# Patient Record
Sex: Female | Born: 1990
Health system: Southern US, Community
[De-identification: ages and names within clinical notes are randomized; demographics above are authoritative.]

## PROBLEM LIST (undated history)

## (undated) ENCOUNTER — Inpatient Hospital Stay (HOSPITAL_COMMUNITY): Payer: 59

## (undated) DIAGNOSIS — R000 Tachycardia, unspecified: Secondary | ICD-10-CM

## (undated) DIAGNOSIS — I2699 Other pulmonary embolism without acute cor pulmonale: Secondary | ICD-10-CM

## (undated) DIAGNOSIS — M419 Scoliosis, unspecified: Secondary | ICD-10-CM

## (undated) DIAGNOSIS — R002 Palpitations: Secondary | ICD-10-CM

## (undated) DIAGNOSIS — O139 Gestational [pregnancy-induced] hypertension without significant proteinuria, unspecified trimester: Secondary | ICD-10-CM

## (undated) HISTORY — DX: Other pulmonary embolism without acute cor pulmonale: I26.99

## (undated) HISTORY — PX: BACK SURGERY: SHX140

---
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
## 2008-02-15 ENCOUNTER — Ambulatory Visit: Payer: Self-pay | Admitting: Physician Assistant

## 2008-02-15 ENCOUNTER — Inpatient Hospital Stay (HOSPITAL_COMMUNITY): Admission: AD | Admit: 2008-02-15 | Discharge: 2008-02-17 | Payer: Self-pay | Admitting: Obstetrics and Gynecology

## 2010-01-07 ENCOUNTER — Emergency Department (HOSPITAL_COMMUNITY)
Admission: EM | Admit: 2010-01-07 | Discharge: 2010-01-08 | Payer: Self-pay | Source: Home / Self Care | Admitting: Emergency Medicine

## 2010-03-22 LAB — BASIC METABOLIC PANEL
BUN: 6 mg/dL (ref 6–23)
CO2: 25 mEq/L (ref 19–32)
Calcium: 8.9 mg/dL (ref 8.4–10.5)
Chloride: 101 mEq/L (ref 96–112)
Creatinine, Ser: 0.68 mg/dL (ref 0.4–1.2)

## 2010-03-22 LAB — CBC
MCH: 28.6 pg (ref 26.0–34.0)
MCV: 82.8 fL (ref 78.0–100.0)
Platelets: 218 10*3/uL (ref 150–400)
RBC: 4.82 MIL/uL (ref 3.87–5.11)
RDW: 13.2 % (ref 11.5–15.5)

## 2010-03-22 LAB — DIFFERENTIAL
Basophils Absolute: 0 10*3/uL (ref 0.0–0.1)
Eosinophils Relative: 2 % (ref 0–5)
Lymphocytes Relative: 12 % (ref 12–46)
Monocytes Absolute: 0.9 10*3/uL (ref 0.1–1.0)
Neutro Abs: 9 10*3/uL — ABNORMAL HIGH (ref 1.7–7.7)

## 2010-03-22 LAB — POCT PREGNANCY, URINE: Preg Test, Ur: NEGATIVE

## 2010-04-27 LAB — COMPREHENSIVE METABOLIC PANEL
ALT: 11 U/L (ref 0–35)
AST: 16 U/L (ref 0–37)
AST: 16 U/L (ref 0–37)
Albumin: 2.3 g/dL — ABNORMAL LOW (ref 3.5–5.2)
Albumin: 2.7 g/dL — ABNORMAL LOW (ref 3.5–5.2)
Alkaline Phosphatase: 144 U/L — ABNORMAL HIGH (ref 39–117)
Alkaline Phosphatase: 167 U/L — ABNORMAL HIGH (ref 39–117)
CO2: 26 mEq/L (ref 19–32)
Calcium: 8 mg/dL — ABNORMAL LOW (ref 8.4–10.5)
Calcium: 8.5 mg/dL (ref 8.4–10.5)
Chloride: 100 mEq/L (ref 96–112)
Chloride: 101 mEq/L (ref 96–112)
Creatinine, Ser: <0.3 mg/dL — ABNORMAL LOW (ref 0.4–1.2)
GFR calc Af Amer: >60 mL/min (ref >60)
Potassium: 2.6 mEq/L — CL (ref 3.5–5.1)
Sodium: 136 mEq/L (ref 135–145)
Total Protein: 5.2 g/dL — ABNORMAL LOW (ref 6.0–8.3)
Total Protein: 5.8 g/dL — ABNORMAL LOW (ref 6.0–8.3)

## 2010-04-27 LAB — BASIC METABOLIC PANEL
BUN: 2 mg/dL — ABNORMAL LOW (ref 6–23)
CO2: 26 mEq/L (ref 19–32)
Chloride: 106 mEq/L (ref 96–112)
Potassium: 3.8 mEq/L (ref 3.5–5.1)
Sodium: 137 mEq/L (ref 135–145)

## 2010-04-27 LAB — CBC
HCT: 26.8 % — ABNORMAL LOW (ref 36.0–46.0)
HCT: 31.5 % — ABNORMAL LOW (ref 36.0–46.0)
Hemoglobin: 10.3 g/dL — ABNORMAL LOW (ref 12.0–15.0)
MCHC: 32.7 g/dL (ref 30.0–36.0)
MCV: 81.4 fL (ref 78.0–100.0)
MCV: 81.7 fL (ref 78.0–100.0)
RDW: 15.2 % (ref 11.5–15.5)
RDW: 15.5 % (ref 11.5–15.5)
WBC: 11.4 10*3/uL — ABNORMAL HIGH (ref 4.0–10.5)
WBC: 8.2 10*3/uL (ref 4.0–10.5)

## 2010-04-27 LAB — CCBB MATERNAL DONOR DRAW

## 2011-05-27 ENCOUNTER — Encounter (HOSPITAL_COMMUNITY): Payer: Self-pay | Admitting: *Deleted

## 2011-05-27 ENCOUNTER — Emergency Department (HOSPITAL_COMMUNITY)
Admission: EM | Admit: 2011-05-27 | Discharge: 2011-05-27 | Disposition: A | Discharge status: Home / Self Care | Payer: Medicaid Other | Attending: Emergency Medicine | Admitting: Emergency Medicine

## 2011-05-27 ENCOUNTER — Emergency Department (HOSPITAL_COMMUNITY): Payer: Medicaid Other

## 2011-05-27 DIAGNOSIS — Z981 Arthrodesis status: Secondary | ICD-10-CM | POA: Insufficient documentation

## 2011-05-27 DIAGNOSIS — G8929 Other chronic pain: Secondary | ICD-10-CM

## 2011-05-27 DIAGNOSIS — M549 Dorsalgia, unspecified: Secondary | ICD-10-CM | POA: Insufficient documentation

## 2011-05-27 HISTORY — DX: Scoliosis, unspecified: M41.9

## 2011-05-27 MED ORDER — IBUPROFEN 800 MG PO TABS
800.0000 mg | ORAL_TABLET | Freq: Once | ORAL | Status: AC
  Administered 2011-05-27: 800 mg via ORAL
  Filled 2011-05-27: qty 1

## 2011-05-27 MED ORDER — HYDROCODONE-ACETAMINOPHEN 5-325 MG PO TABS: 1.0000 | ORAL_TABLET | ORAL | Status: AC | PRN

## 2011-05-27 MED ORDER — IBUPROFEN 600 MG PO TABS: 600.0000 mg | ORAL_TABLET | Freq: Three times a day (TID) | ORAL | Status: AC

## 2011-05-27 MED ORDER — CYCLOBENZAPRINE HCL 5 MG PO TABS: 5.0000 mg | ORAL_TABLET | Freq: Three times a day (TID) | ORAL | Status: AC | PRN

## 2011-05-27 NOTE — ED Provider Notes (Signed)
History     CSN: [id number]  Arrival date & time 05/27/11  1148   First MD Initiated Contact with Patient 05/27/11 1205      Chief Complaint  Patient presents with  . Back Pain    (Consider location/radiation/quality/duration/timing/severity/associated sxs/prior treatment) HPI Comments: Patient presents for evaluation of mid bilateral back pain which she describes as aching and constant.  She has a history of some degree of daily back pain associated with her history of scoliosis and back surgery for [facility] rod placement at the age of 5.  She has not had followup care of this condition.  She started working one month ago which involves some  bending and picking up light objects, and her pain has been worsened intermittently since starting a new job.  She woke at 5 AM this morning with severe pain which is worse with movement and raising of her right shoulder.  She describes a sensation of bone-on-bone in her right scapula near her spine with raising her right arm.  She does take occasional ibuprofen which does not relieve her symptoms.  She has not had any today.  She has used a heating pad also without relief.  She denies fevers or chills, no chest pain, shortness of breath or cough.  Patient is a 21 y.o. female presenting with back pain. The history is provided by the patient.  Back Pain  This is a recurrent problem. Pertinent negatives include no chest pain, no fever, no numbness, no abdominal pain, no dysuria and no weakness.    Past Medical History  Diagnosis Date  . Scoliosis     Past Surgical History  Procedure Date  . Back surgery     No family history on file.  History  Substance Use Topics  . Smoking status: Current Everyday Smoker  . Smokeless tobacco: Not on file  . Alcohol Use: No    OB History    Grav Para Term Preterm Abortions TAB SAB Ect Mult Living                  Review of Systems  Constitutional: Negative for fever.  Respiratory: Negative for  shortness of breath.   Cardiovascular: Negative for chest pain and leg swelling.  Gastrointestinal: Negative for abdominal pain, constipation and abdominal distention.  Genitourinary: Negative for dysuria, urgency, frequency, flank pain and difficulty urinating.  Musculoskeletal: Positive for back pain. Negative for joint swelling and gait problem.  Skin: Negative for rash.  Neurological: Negative for weakness and numbness.    Allergies  Penicillins  Home Medications   Current Outpatient Rx  Name Route Sig Dispense Refill  . CYCLOBENZAPRINE HCL 5 MG PO TABS Oral Take 1 tablet (5 mg total) by mouth 3 (three) times daily as needed for muscle spasms. 15 tablet 0  . HYDROCODONE-ACETAMINOPHEN 5-325 MG PO TABS Oral Take 1 tablet by mouth every 4 (four) hours as needed for pain. 20 tablet 0  . IBUPROFEN 600 MG PO TABS Oral Take 1 tablet (600 mg total) by mouth 3 (three) times daily. 21 tablet 0    BP 127/76  Pulse 103  Temp(Src) 98.6 F (37 C) (Oral)  Resp 17  Ht 5\' 7"  (1.702 m)  Wt 150 lb (68.04 kg)  BMI 23.49 kg/m2  SpO2 99%  LMP 05/27/2011  Physical Exam  Nursing note and vitals reviewed. Constitutional: She appears well-developed and well-nourished.  HENT:  Head: Normocephalic.  Eyes: Conjunctivae are normal.  Neck: Normal range of motion. Neck supple.  Cardiovascular: Normal rate and intact distal pulses.        Pedal pulses normal.  Pulmonary/Chest: Effort normal.  Abdominal: Soft. Bowel sounds are normal. She exhibits no distension and no mass.  Musculoskeletal: Normal range of motion. She exhibits tenderness. She exhibits no edema.       Thoracic back: She exhibits tenderness. She exhibits no swelling, no edema and no deformity.       Back:       Midline surgical incision from upper thoracic through lumbar present.  Neurological: She is alert. She has normal strength. She displays no atrophy and no tremor. No sensory deficit. Gait normal.  Reflex Scores:       Patellar reflexes are 2+ on the right side and 2+ on the left side.      Achilles reflexes are 2+ on the right side and 2+ on the left side.      No strength deficit noted in hip and knee flexor and extensor muscle groups.  Ankle flexion and extension intact.  Skin: Skin is warm and dry.  Psychiatric: She has a normal mood and affect.    ED Course  Procedures (including critical care time)  Labs Reviewed - No data to display Dg Thoracic Spine 2 View  05/27/2011  *RADIOLOGY REPORT*  Clinical Data: 21 year old female with back pain.  History of scoliosis surgery at age 56.  THORACIC SPINE - 2 VIEW  Comparison: 01/07/2010.  Findings: Bilateral posterior spinal rods with a mix of pedicle screws and laminar hooks.  The hardware appears stable since 2011. Underlying S-shaped thoracolumbar scoliosis has not significantly changed.  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  Ankylosis of the medial left costochondral junctions is noted.  No acute osseous abnormality.  Grossly stable visualized thoracic visceral contours.  IMPRESSION: Stable postoperative appearance of the thoracic spine.  Original Report Authenticated By: Ulla Potash III, M.D.     1. Back pain, chronic       MDM  Patient prescribed cyclobenzaprine, hydrocodone and ibuprofen 600 mg.  Work note given for today.  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  Strongly suggested she speak with the temp agency about placement in a position that would be less stressful to her back which she plans to do.  Also, suggested recheck by her surgeon at [facility] if her increased pain persists.  Also given referral numbers for obtaining primary medical care.      Burgess Amor, PA 05/27/11 1700  Burgess Amor, PA 05/27/11 [phone]

## 2011-05-27 NOTE — ED Provider Notes (Signed)
Medical screening examination/treatment/procedure(s) were performed by non-physician practitioner and as supervising physician I was immediately available for consultation/collaboration.  Shelda Jakes, MD 05/27/11 [phone]

## 2011-05-27 NOTE — ED Notes (Signed)
Pt presents with lower and mid back pain with no radiation of said pain, also c/o rt shoulder pain.  Pt has extensive history of back/spinal surgery secondary to scoliosis. Pt reports an increase in workload in past 2 months. Pt has noticed and increase in pain level since.

## 2011-05-27 NOTE — ED Notes (Signed)
Mid and lower back pain and pain to right shoulder blade. Woke up at 0500 with the pain.

## 2011-05-27 NOTE — Discharge Instructions (Signed)
Back Pain, Adult Low back pain is very common. About 1 in 5 people have back pain.The cause of low back pain is rarely dangerous. The pain often gets better over time.About half of people with a sudden onset of back pain feel better in just 2 weeks. About 8 in 10 people feel better by 6 weeks.  CAUSES Some common causes of back pain include:  Strain of the muscles or ligaments supporting the spine.   Wear and tear (degeneration) of the spinal discs.   Arthritis.   Direct injury to the back.  DIAGNOSIS Most of the time, the direct cause of low back pain is not known.However, back pain can be treated effectively even when the exact cause of the pain is unknown.Answering your caregiver's questions about your overall health and symptoms is one of the most accurate ways to make sure the cause of your pain is not dangerous. If your caregiver needs more information, he or she may order lab work or imaging tests (X-rays or MRIs).However, even if imaging tests show changes in your back, this usually does not require surgery. HOME CARE INSTRUCTIONS For many people, back pain returns.Since low back pain is rarely dangerous, it is often a condition that people can learn to manageon their own.   Remain active. It is stressful on the back to sit or stand in one place. Do not sit, drive, or stand in one place for more than 30 minutes at a time. Take short walks on level surfaces as soon as pain allows.Try to increase the length of time you walk each day.   Do not stay in bed.Resting more than 1 or 2 days can delay your recovery.   Do not avoid exercise or work.Your body is made to move.It is not dangerous to be active, even though your back may hurt.Your back will likely heal faster if you return to being active before your pain is gone.   Pay attention to your body when you bend and lift. Many people have less discomfortwhen lifting if they bend their knees, keep the load close to their  bodies,and avoid twisting. Often, the most comfortable positions are those that put less stress on your recovering back.   Find a comfortable position to sleep. Use a firm mattress and lie on your side with your knees slightly bent. If you lie on your back, put a pillow under your knees.   Only take over-the-counter or prescription medicines as directed by your caregiver. Over-the-counter medicines to reduce pain and inflammation are often the most helpful.Your caregiver may prescribe muscle relaxant drugs.These medicines help dull your pain so you can more quickly return to your normal activities and healthy exercise.   Put ice on the injured area.   Put ice in a plastic bag.   Place a towel between your skin and the bag.   Leave the ice on for 15 to 20 minutes, 3 to 4 times a day for the first 2 to 3 days. After that, ice and heat may be alternated to reduce pain and spasms.   Ask your caregiver about trying back exercises and gentle massage. This may be of some benefit.   Avoid feeling anxious or stressed.Stress increases muscle tension and can worsen back pain.It is important to recognize when you are anxious or stressed and learn ways to manage it.Exercise is a great option.  SEEK MEDICAL CARE IF:  You have pain that is not relieved with rest or medicine.   You have   pain that does not improve in 1 week.   You have new symptoms.   You are generally not feeling well.  SEEK IMMEDIATE MEDICAL CARE IF:   You have pain that radiates from your back into your legs.   You develop new bowel or bladder control problems.   You have unusual weakness or numbness in your arms or legs.   You develop nausea or vomiting.   You develop abdominal pain.   You feel faint.  Document Released: 12/27/2004 Document Revised: 12/16/2010 Document Reviewed: 05/17/2010 [facility] Patient Information 2012 [place], [place].   You may use the medicines prescribed for your pain and inflammation.   Rest,  Continue using your heating pad for 20 minutes several times daily.  See the resource guide below for locating a primary doctor,  Or consider getting re-evaluated by your surgeon at [facility] if your symptoms continue to be worsened.

## 2011-07-15 ENCOUNTER — Encounter (HOSPITAL_COMMUNITY): Payer: Self-pay | Admitting: *Deleted

## 2011-07-15 ENCOUNTER — Emergency Department (HOSPITAL_COMMUNITY)
Admission: EM | Admit: 2011-07-15 | Discharge: 2011-07-15 | Disposition: A | Discharge status: Home / Self Care | Payer: Medicaid Other | Attending: Emergency Medicine | Admitting: Emergency Medicine

## 2011-07-15 DIAGNOSIS — K029 Dental caries, unspecified: Secondary | ICD-10-CM | POA: Insufficient documentation

## 2011-07-15 DIAGNOSIS — M4120 Other idiopathic scoliosis, site unspecified: Secondary | ICD-10-CM | POA: Insufficient documentation

## 2011-07-15 DIAGNOSIS — K0889 Other specified disorders of teeth and supporting structures: Secondary | ICD-10-CM

## 2011-07-15 DIAGNOSIS — F17200 Nicotine dependence, unspecified, uncomplicated: Secondary | ICD-10-CM | POA: Insufficient documentation

## 2011-07-15 MED ORDER — HYDROCODONE-ACETAMINOPHEN 5-325 MG PO TABS: ORAL_TABLET | ORAL | Status: AC

## 2011-07-15 MED ORDER — CLINDAMYCIN HCL 150 MG PO CAPS: ORAL_CAPSULE | ORAL | Status: AC

## 2011-07-15 MED ORDER — NAPROXEN 250 MG PO TABS: 250.0000 mg | ORAL_TABLET | Freq: Two times a day (BID) | ORAL | Status: DC

## 2011-07-15 NOTE — ED Notes (Signed)
Intermittent pain to left bottom tooth x 5-6 months with swelling.  Reports this episode is not going away.

## 2011-07-15 NOTE — ED Provider Notes (Signed)
History     CSN: [id number]  Arrival date & time 07/15/11  1718   First MD Initiated Contact with Patient 07/15/11 1726      Chief Complaint  Patient presents with  . Dental Pain     HPI Pt was seen at 1730.  Per pt, c/o gradual onset and persistence of constant left lower tooth "pain" for the past 5 to 6 months.  Denies fevers, no intra-oral edema, no rash, no facial swelling, no dysphagia, no neck pain.   The condition is aggravated by nothing. The condition is relieved by nothing. The symptoms have been associated with no other complaints. The patient has no significant history of serious medical conditions.     Past Medical History  Diagnosis Date  . Scoliosis     Past Surgical History  Procedure Date  . Back surgery     History  Substance Use Topics  . Smoking status: Current Everyday Smoker  . Smokeless tobacco: Not on file  . Alcohol Use: No    Review of Systems ROS: Statement: All systems negative except as marked or noted in the HPI; Constitutional: Negative for fever and chills. ; ; Eyes: Negative for eye pain and discharge. ; ; ENMT: Positive for dental caries, and toothache. Negative for ear pain, bleeding gums, dental injury, facial deformity, facial swelling, hoarseness, nasal congestion, sinus pressure, sore throat, throat swelling and tongue swollen. ; ; Cardiovascular: Negative for chest pain, palpitations, diaphoresis, dyspnea and peripheral edema. ; ; Respiratory: Negative for cough, wheezing and stridor. ; ; Gastrointestinal: Negative for nausea, vomiting, diarrhea and abdominal pain. ; ; Genitourinary: Negative for dysuria, flank pain and hematuria. ; ; Musculoskeletal: Negative for back pain and neck pain. ; ; Skin: Negative for rash and skin lesion. ; ; Neuro: Negative for headache, lightheadedness and neck stiffness. ;    Allergies  Penicillins  Home Medications  No current outpatient prescriptions on file.  BP 119/72  Temp 98.6 F (37 C) (Oral)   Resp 18  Ht 5\' 7"  (1.702 m)  Wt 150 lb (68.04 kg)  BMI 23.49 kg/m2  SpO2 98%  LMP 07/15/2011  Physical Exam 1735: Physical examination: Vital signs and O2 SAT: Reviewed; Constitutional: Well developed, Well nourished, Well hydrated, In no acute distress; Head and Face: Normocephalic, Atraumatic; Eyes: EOMI, PERRL, No scleral icterus; ENMT: Mouth and pharynx normal, Widespread dental decay, Left TM normal, Right TM normal, Mucous membranes moist, +lower left 2nd molar with dental decay.  No gingival erythema, edema, fluctuance, or drainage.  No hoarse voice, no drooling, no stridor.  ; Neck: Supple, Full range of motion, No lymphadenopathy; Cardiovascular: Regular rate and rhythm, No murmur, rub, or gallop; Respiratory: Breath sounds clear & equal bilaterally, No rales, rhonchi, wheezes, or rub, Normal respiratory effort/excursion; Chest: Nontender, Movement normal; Extremities: Pulses normal, No tenderness, No edema; Neuro: AA&Ox3, Major CN grossly intact.  No gross focal motor or sensory deficits in extremities.; Skin: Color normal, No rash, No petechiae, Warm, Dry   ED Course  Procedures  MDM  MDM Reviewed: nursing note and vitals            Laray Anger, DO 07/17/11 0200

## 2011-07-15 NOTE — ED Notes (Signed)
Pt reports dental pain for several months.  Now thinks she is having facial swelling.

## 2012-01-11 HISTORY — PX: WISDOM TOOTH EXTRACTION: SHX21

## 2012-03-07 ENCOUNTER — Ambulatory Visit (INDEPENDENT_AMBULATORY_CARE_PROVIDER_SITE_OTHER): Payer: Medicaid Other | Admitting: Gastroenterology

## 2012-03-07 ENCOUNTER — Encounter: Payer: Self-pay | Admitting: Gastroenterology

## 2012-03-07 ENCOUNTER — Telehealth: Payer: Self-pay | Admitting: Gastroenterology

## 2012-03-07 VITALS — BP 114/69 | HR 94 | Temp 98.2°F | Ht 67.0 in | Wt 150.8 lb

## 2012-03-07 DIAGNOSIS — K921 Melena: Secondary | ICD-10-CM | POA: Insufficient documentation

## 2012-03-07 DIAGNOSIS — R109 Unspecified abdominal pain: Secondary | ICD-10-CM

## 2012-03-07 DIAGNOSIS — K625 Hemorrhage of anus and rectum: Secondary | ICD-10-CM

## 2012-03-07 MED ORDER — PEG 3350-KCL-NA BICARB-NACL 420 G PO SOLR: 4000.0000 mL | ORAL | Status: DC

## 2012-03-07 MED ORDER — OMEPRAZOLE 20 MG PO CPDR: 20.0000 mg | DELAYED_RELEASE_CAPSULE | Freq: Every day | ORAL | Status: DC

## 2012-03-07 NOTE — Progress Notes (Signed)
Referring Provider: Elfredia Nevins, MD Primary Care Physician:  Cassell Smiles., MD Primary Gastroenterologist:  Dr. Darrick Penna   Chief Complaint  Patient presents with  . Abdominal Pain  . Diarrhea    HPI:   Shirley Peters is a pleasant 22 year old female who presents today at the request of Dwyane Luo, PA/Dr. Phillips Odor. She was referred secondary to abdominal pain and diarrhea. CBC normal, TSH normal, CMP normal, celiac serologies normal. Stool studies not completed.   Presents with month-long abdominal pain, cramping, burning, diarrhea. Multiple episodes of loose stool, not related to eating/drinking. +rectal bleeding with paper hematochezia and in toilet. Mild burning in rectum. +black/tarry stool. Notes epigastric and lower abdominal discomfort. Burns all over. Notes chest discomfort as well. No reflux. Prior to onset of episode, took Ibuprofen due to wisdom teeth removal. Would take about 600 mg multiple times a day for about a week and a half. No aspirin powders. No sick contacts, no antibiotics. +wt loss of about 4 lbs. Stays hungry, unable to eat a lot. +early satiety. +nausea but no vomiting. Her baseline is constipation, with a bowel movement once or twice a week.    Past Medical History  Diagnosis Date  . Scoliosis     Past Surgical History  Procedure Laterality Date  . Back surgery      age 1  . Wisdom tooth extraction  Jan 2014    Current Outpatient Prescriptions  Medication Sig Dispense Refill  . omeprazole (PRILOSEC) 20 MG capsule Take 1 capsule (20 mg total) by mouth daily.  60 capsule  3  . polyethylene glycol-electrolytes (TRILYTE) 420 G solution Take 4,000 mLs by mouth as directed.  4000 mL  0   No current facility-administered medications for this visit.    Allergies as of 03/07/2012 - Review Complete 03/07/2012  Allergen Reaction Noted  . Penicillins  05/27/2011    Family History  Problem Relation Age of Onset  . Colon cancer Neg Hx     History   Social  History  . Marital Status: Single    Spouse Name: N/A    Number of Children: 1  . Years of Education: N/A   Occupational History  . [employer]    Social History Main Topics  . Smoking status: Current Every Day Smoker  . Smokeless tobacco: Not on file     Comment: 1 pack lasts a week  . Alcohol Use: No  . Drug Use: No  . Sexually Active: Yes    Birth Control/ Protection: Implant     Comment: irregular menses   Other Topics Concern  . Not on file   Social History Narrative   Son turned 4 on Feb 5          Review of Systems: Gen: SEE HPI, +night sweats recently  CV: +chest discomfort Resp: Denies shortness of breath with rest, cough, wheezing GI: SEE HPI GU : Denies urinary burning, urinary frequency, urinary incontinence.  MS: Denies joint pain, muscle weakness, cramps, limited movement Derm: Denies rash, itching, dry skin Psych: Denies depression, anxiety, confusion or memory loss  Heme: Denies bruising, bleeding, and enlarged lymph nodes.  Physical Exam: BP 114/69  Pulse 94  Temp(Src) 98.2 F (36.8 C) (Oral)  Ht 5\' 7"  (1.702 m)  Wt 150 lb 12.8 oz (68.402 kg)  BMI 23.61 kg/m2  LMP 02/29/2012 General:   Alert and oriented. Well-developed, well-nourished, pleasant and cooperative. Head:  Normocephalic and atraumatic. Eyes:  Conjunctiva pink, sclera clear, no icterus.   Conjunctiva  pink. Ears:  Normal auditory acuity. Nose:  No deformity, discharge,  or lesions. Mouth:  No deformity or lesions, mucosa pink and moist.  Neck:  Supple, without mass or thyromegaly. Lungs:  Clear to auscultation bilaterally, without wheezing, rales, or rhonchi.  Heart:  S1, S2 present without murmurs noted.  Abdomen:  +BS, soft, non-tender and non-distended. Without mass or HSM. No rebound or guarding. No hernias noted. Rectal:  Deferred  Msk:  Symmetrical without gross deformities. Normal posture. Extremities:  Without clubbing or edema. Neurologic:  Alert and  oriented x4;   grossly normal neurologically. Skin:  Intact, warm and dry without significant lesions or rashes Cervical Nodes:  No significant cervical adenopathy. Psych:  Alert and cooperative. Normal mood and affect.

## 2012-03-07 NOTE — Assessment & Plan Note (Signed)
Bright red blood per rectum, moderate amount, normal CBC in the setting of multiple doses of Ibuprofen in Jan (prior to onset of symptoms). Rectal discomfort improving. May be secondary to benign anorectal source. Also, with diarrhea, question flare of hemorrhoids. Unable to exclude evolving IBD. Doubt infectious process. TCS as planned.

## 2012-03-07 NOTE — Telephone Encounter (Signed)
I know patient was seen by me today, but I've decided I would like to obtain stool studies to include Cdiff PCR and stool culture. Please apologize to her on my behalf for any inconvenience. I feel this needs to be done to be thorough. Thanks!  TCS is early March. Hopefully, we can get these results back quickly. I am not doing the GI pathogen file, just standard Cdiff and culture.

## 2012-03-07 NOTE — Assessment & Plan Note (Signed)
22 year old female with month-long symptoms of epigastric and lower abdominal pain, intermittent, no aggravating factors. Notes prior to onset of symptoms was taking multiple doses of Ibuprofen quite frequently due to wisdom teeth extraction. Notes associated rectal bleeding, melena, and loose stools. CBC normal, celiac screen negative, TSH normal. Doubt infectious process at this point. However, will obtain Cdiff PCR and stool culture. Concern for NSAID-enteropathy, PUD, evolving IBD. Start Prilosec BID.   Proceed with TCS/EGD with Dr. Darrick Penna in near future. The risks, benefits, alternatives were discussed in detail with patient. She states understanding. Decision to obtain stool studies was not made until after visit. Again, I doubt this is an infectious process; however, we will ask patient to complete these.

## 2012-03-07 NOTE — Patient Instructions (Addendum)
Continue to stay away from Ibuprofen.  Start taking Prilosec twice a day, 30 minutes before breakfast and dinner.   We have scheduled you for a colonoscopy and upper endoscopy with Dr. Darrick Penna in the near future.

## 2012-03-07 NOTE — Assessment & Plan Note (Signed)
Question secondary to NSAID use. Concern for gastritis, PUD. Start Prilosec BID. EGD in near future as planned.

## 2012-03-07 NOTE — Progress Notes (Signed)
Faxed to PCP

## 2012-03-08 NOTE — Telephone Encounter (Signed)
I cancelled the Procedure and I will wait to hear back from the patient when she decides what she wants to do

## 2012-03-08 NOTE — Telephone Encounter (Signed)
LMOM to call.

## 2012-03-08 NOTE — Telephone Encounter (Signed)
Ok. Sounds good. Awaiting stool studies.

## 2012-03-08 NOTE — Telephone Encounter (Signed)
Pt returned call and was informed. She will come by today to get the stool containers/orders. She said the Prilosec is helping her a lot. She is not saying she does not want to do the colonoscopy, but she would like to hold off on it until she gets the stool results and have a little more time to see how she is going to be. I told her I will let Gerrit Halls, NP and Soledad Gerlach know.

## 2012-03-09 LAB — CBC
HCT: 39 %
Hemoglobin: 13.3 g/dL (ref 12.0–16.0)
RBC: 4.64
WBC: 7.8

## 2012-03-09 LAB — COMPREHENSIVE METABOLIC PANEL
Albumin: 4.8
Alkaline Phosphatase: 74 U/L
Calcium: 9.6 mg/dL
Total Bilirubin: 0.3 mg/dL
Total Protein: 7.3 g/dL

## 2012-03-13 ENCOUNTER — Ambulatory Visit (HOSPITAL_COMMUNITY): Admission: RE | Admit: 2012-03-13 | Payer: Medicaid Other | Source: Ambulatory Visit | Admitting: Gastroenterology

## 2012-03-13 ENCOUNTER — Encounter (HOSPITAL_COMMUNITY): Admission: RE | Payer: Self-pay | Source: Ambulatory Visit

## 2012-03-13 SURGERY — COLONOSCOPY WITH ESOPHAGOGASTRODUODENOSCOPY (EGD)
Anesthesia: Moderate Sedation

## 2012-06-30 NOTE — Progress Notes (Signed)
REVIEWED.  CONTACT PT TO [facility] TCS/EGD.

## 2012-07-02 NOTE — Progress Notes (Signed)
Cc PCP 

## 2014-07-22 ENCOUNTER — Emergency Department (HOSPITAL_COMMUNITY)
Admission: EM | Admit: 2014-07-22 | Discharge: 2014-07-22 | Disposition: A | Discharge status: Home / Self Care | Payer: Medicaid Other | Attending: Emergency Medicine | Admitting: Emergency Medicine

## 2014-07-22 ENCOUNTER — Emergency Department (HOSPITAL_COMMUNITY): Payer: Medicaid Other

## 2014-07-22 ENCOUNTER — Encounter (HOSPITAL_COMMUNITY): Payer: Self-pay | Admitting: Emergency Medicine

## 2014-07-22 DIAGNOSIS — Z720 Tobacco use: Secondary | ICD-10-CM | POA: Insufficient documentation

## 2014-07-22 DIAGNOSIS — Z880 Allergy status to penicillin: Secondary | ICD-10-CM | POA: Insufficient documentation

## 2014-07-22 DIAGNOSIS — M79672 Pain in left foot: Secondary | ICD-10-CM

## 2014-07-22 DIAGNOSIS — Y9339 Activity, other involving climbing, rappelling and jumping off: Secondary | ICD-10-CM | POA: Insufficient documentation

## 2014-07-22 DIAGNOSIS — Z792 Long term (current) use of antibiotics: Secondary | ICD-10-CM | POA: Insufficient documentation

## 2014-07-22 DIAGNOSIS — Y9234 Swimming pool (public) as the place of occurrence of the external cause: Secondary | ICD-10-CM | POA: Insufficient documentation

## 2014-07-22 DIAGNOSIS — S99922A Unspecified injury of left foot, initial encounter: Secondary | ICD-10-CM | POA: Insufficient documentation

## 2014-07-22 DIAGNOSIS — Z8739 Personal history of other diseases of the musculoskeletal system and connective tissue: Secondary | ICD-10-CM | POA: Insufficient documentation

## 2014-07-22 DIAGNOSIS — Y998 Other external cause status: Secondary | ICD-10-CM | POA: Insufficient documentation

## 2014-07-22 DIAGNOSIS — W16522A Jumping or diving into swimming pool striking bottom causing other injury, initial encounter: Secondary | ICD-10-CM | POA: Insufficient documentation
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
## 2015-02-20 ENCOUNTER — Encounter: Payer: Self-pay | Admitting: Adult Health

## 2015-08-24 ENCOUNTER — Ambulatory Visit (INDEPENDENT_AMBULATORY_CARE_PROVIDER_SITE_OTHER): Payer: BLUE CROSS/BLUE SHIELD | Admitting: Women's Health

## 2015-08-24 ENCOUNTER — Encounter: Payer: Self-pay | Admitting: Women's Health

## 2015-08-24 VITALS — BP 122/72 | HR 72 | Wt 165.0 lb

## 2015-08-24 DIAGNOSIS — Z30017 Encounter for initial prescription of implantable subdermal contraceptive: Secondary | ICD-10-CM | POA: Diagnosis not present

## 2015-08-24 DIAGNOSIS — Z3009 Encounter for other general counseling and advice on contraception: Secondary | ICD-10-CM

## 2015-08-24 DIAGNOSIS — Z3202 Encounter for pregnancy test, result negative: Secondary | ICD-10-CM

## 2015-08-24 DIAGNOSIS — Z3046 Encounter for surveillance of implantable subdermal contraceptive: Secondary | ICD-10-CM | POA: Diagnosis not present

## 2015-08-24 DIAGNOSIS — Z308 Encounter for other contraceptive management: Secondary | ICD-10-CM

## 2015-08-24 LAB — POCT URINE PREGNANCY: Preg Test, Ur: NEGATIVE

## 2015-08-24 NOTE — Patient Instructions (Signed)

## 2015-08-24 NOTE — Progress Notes (Signed)
Shirley Peters is a 25 y.o. year old  Caucasian female here for Nexplanon removal and reinsertion.  She was given informed consent for removal and reinsertion of her Nexplanon. Her Nexplanon was placed 2014, No LMP recorded., and her pregnancy test today was neg.  Last pap ~306mths ago at [facility]-normal. 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  Risks/benefits/side effects of Nexplanon have been discussed and her questions have been answered.  Specifically, a failure rate of 01/998 has been reported, with an increased failure rate if pt takes St. John's Wort and/or antiseizure medicaitons.  Shirley Peters is aware of the common side effect of irregular bleeding, which the incidence of decreases over time.  BP 122/72 (BP Location: Right Arm, Patient Position: Sitting, Cuff Size: Normal)   Pulse 72   Wt 165 lb (74.8 kg)   BMI 25.84 kg/m  No LMP recorded. Results for orders placed or performed in visit on 08/24/15 (from the past 24 hour(s))  POCT urine pregnancy   Collection Time: 08/24/15  9:07 AM  Result Value Ref Range   Preg Test, Ur Negative Negative     Appropriate time out taken. Nexplanon site identified.  Area prepped in usual sterile fashon. Two cc's of 2% lidocaine was used to anesthetize the area. A small stab incision was made right beside the implant on the distal portion.  The Nexplanon rod was grasped using hemostats and removed intact without difficulty.  The area was cleansed again with betadine and the Nexplanon was inserted per manufacturer's recommendations without difficulty.  Steri-strips and a pressure bandage was applied.  There was less than 3 cc blood loss. There were no complications.  The patient tolerated the procedure well.  She was instructed to keep the area clean and dry, remove pressure bandage in 24 hours, and keep insertion site covered with the steri-strips for 3-5 days.  She was given a card indicating date Nexplanon was inserted and date it needs to be removed.   Follow-up PRN  problems.  Marge DuncansBooker, Coltan Spinello Randall CNM, [facility] 08/24/2015 9:07 AM

## 2016-02-09 ENCOUNTER — Ambulatory Visit (HOSPITAL_COMMUNITY)
Admission: RE | Admit: 2016-02-09 | Discharge: 2016-02-09 | Disposition: A | Discharge status: Home / Self Care | Payer: BLUE CROSS/BLUE SHIELD | Source: Ambulatory Visit | Attending: Internal Medicine | Admitting: Internal Medicine

## 2016-02-09 DIAGNOSIS — R002 Palpitations: Secondary | ICD-10-CM | POA: Insufficient documentation

## 2017-06-14 DIAGNOSIS — J206 Acute bronchitis due to rhinovirus: Secondary | ICD-10-CM | POA: Diagnosis not present

## 2017-06-14 DIAGNOSIS — R002 Palpitations: Secondary | ICD-10-CM | POA: Diagnosis not present

## 2017-06-14 DIAGNOSIS — R7612 Nonspecific reaction to cell mediated immunity measurement of gamma interferon antigen response without active tuberculosis: Secondary | ICD-10-CM | POA: Diagnosis not present

## 2018-03-01 MED FILL — CITALOPRAM HBR 20 MG TABLET: 20 | 30 days supply | Qty: 30 | Fill #0

## 2018-03-30 MED FILL — CITALOPRAM HBR 20 MG TABLET: 20 | 30 days supply | Qty: 30 | Fill #1 | Status: TO

## 2018-04-28 MED FILL — CITALOPRAM HBR 20 MG TABLET: 20 | 30 days supply | Qty: 30 | Fill #0

## 2018-04-30 MED FILL — buPROPion HCL 100 MG TABS: 100 | 30 days supply | Qty: 90 | Fill #0

## 2018-05-23 MED FILL — CITALOPRAM HBR 20 MG TABLET: 20 | 30 days supply | Qty: 30 | Fill #0

## 2018-06-03 ENCOUNTER — Other Ambulatory Visit (HOSPITAL_COMMUNITY)
Admission: RE | Admit: 2018-06-03 | Discharge: 2018-06-03 | Disposition: A | Discharge status: Home / Self Care | Payer: BLUE CROSS/BLUE SHIELD | Source: Ambulatory Visit | Attending: Family Medicine | Admitting: Family Medicine

## 2018-06-03 DIAGNOSIS — Z201 Contact with and (suspected) exposure to tuberculosis: Secondary | ICD-10-CM | POA: Insufficient documentation

## 2018-06-06 LAB — QUANTIFERON-TB GOLD PLUS (RQFGPL)
QuantiFERON Mitogen Value: >10 IU/mL
QuantiFERON Nil Value: 0.02 IU/mL
QuantiFERON TB1 Ag Value: 0.04 IU/mL
QuantiFERON TB2 Ag Value: 0.02 IU/mL

## 2018-06-06 LAB — QUANTIFERON-TB GOLD PLUS: QuantiFERON-TB Gold Plus: NEGATIVE

## 2018-06-16 MED FILL — CITALOPRAM HBR 20 MG TABLET: 20 | 30 days supply | Qty: 30 | Fill #1

## 2018-07-13 MED FILL — CITALOPRAM HBR 20 MG TABLET: 20 | 30 days supply | Qty: 30 | Fill #2

## 2018-08-13 MED FILL — CITALOPRAM HBR 20 MG TABLET: 20 | 30 days supply | Qty: 30 | Fill #0

## 2018-09-06 MED FILL — CITALOPRAM HBR 20 MG TABLET: 20 | 30 days supply | Qty: 30 | Fill #1

## 2018-10-09 MED FILL — CITALOPRAM HBR 20 MG TABLET: 20 | 30 days supply | Qty: 30 | Fill #0

## 2018-12-11 ENCOUNTER — Other Ambulatory Visit: Payer: Self-pay

## 2018-12-11 DIAGNOSIS — Z20822 Contact with and (suspected) exposure to covid-19: Secondary | ICD-10-CM

## 2018-12-13 MED FILL — CITALOPRAM HBR 20 MG TABLET: 20 | 90 days supply | Qty: 90 | Fill #0

## 2019-01-07 ENCOUNTER — Ambulatory Visit: Payer: Managed Care, Other (non HMO) | Attending: Internal Medicine

## 2019-04-01 MED FILL — CITALOPRAM HBR 20 MG TABLET: 20 | 90 days supply | Qty: 90 | Fill #0

## 2019-04-10 ENCOUNTER — Other Ambulatory Visit (HOSPITAL_COMMUNITY)
Admission: RE | Admit: 2019-04-10 | Discharge: 2019-04-10 | Disposition: A | Discharge status: Home / Self Care | Payer: No Typology Code available for payment source | Source: Ambulatory Visit | Attending: Obstetrics & Gynecology | Admitting: Obstetrics & Gynecology

## 2019-04-10 ENCOUNTER — Ambulatory Visit (INDEPENDENT_AMBULATORY_CARE_PROVIDER_SITE_OTHER): Payer: No Typology Code available for payment source | Admitting: Women's Health

## 2019-04-10 ENCOUNTER — Encounter: Payer: Self-pay | Admitting: Women's Health

## 2019-04-10 ENCOUNTER — Other Ambulatory Visit: Payer: Self-pay

## 2019-04-10 VITALS — BP 136/87 | HR 87 | Ht 67.0 in | Wt 193.8 lb

## 2019-04-10 DIAGNOSIS — Z01419 Encounter for gynecological examination (general) (routine) without abnormal findings: Secondary | ICD-10-CM | POA: Insufficient documentation

## 2019-04-10 DIAGNOSIS — Z3202 Encounter for pregnancy test, result negative: Secondary | ICD-10-CM | POA: Diagnosis not present

## 2019-04-10 DIAGNOSIS — Z3046 Encounter for surveillance of implantable subdermal contraceptive: Secondary | ICD-10-CM | POA: Diagnosis not present

## 2019-04-10 DIAGNOSIS — N898 Other specified noninflammatory disorders of vagina: Secondary | ICD-10-CM

## 2019-04-10 LAB — POCT URINE PREGNANCY: Preg Test, Ur: NEGATIVE

## 2019-04-10 MED ORDER — NORETHIN ACE-ETH ESTRAD-FE 1-20 MG-MCG PO TABS: 1.0000 | ORAL_TABLET | Freq: Every day | ORAL | 11 refills | Status: DC

## 2019-04-10 MED FILL — BLISOVI FE 1/20 1-20 MG-MCG: 1-20 | 28 days supply | Qty: 28 | Fill #0

## 2019-04-10 NOTE — Progress Notes (Signed)
WELL-WOMAN EXAMINATION Patient name: Shirley Peters MRN [id number]  Date of birth: May 17, 1990 Chief Complaint:   Nexplanon Removal (Discuss other BCP)  History of Present Illness:   Shirley Peters is a 29 y.o. G33P1001 Caucasian female being seen today for a routine well-woman exam & Nexplanon removal. Nexplanon inserted 08/24/15. Wants COCs. Does not smoke cigarettes, but does smoke e-cig, no h/o HTN, DVT/PE, CVA, MI, or migraines w/ aura.  Current complaints: heavy discharge since 29yo, has to wear tampon daily. No itching/irritation. Occ odor.   Depression screen [facility] 2/9 04/10/2019  Decreased Interest 0  Down, Depressed, Hopeless 0  PHQ - 2 Score 0  Altered sleeping 1  Tired, decreased energy 1  Change in appetite 0  Feeling bad or failure about yourself  0  Trouble concentrating 0  Moving slowly or fidgety/restless 0  Suicidal thoughts 0  PHQ-9 Score 2  Difficult doing work/chores Not difficult at all     PCP: Larene Pickett      does not desire labs No LMP recorded. The current method of family planning is Nexplanon and wants COCs Last pap 2017. Results were: normal. H/O abnormal pap: No Last mammogram: never. Results were: n/a. Family h/o breast cancer: Yes, PGM Last colonoscopy: never. Results were: n/a. Family h/o colorectal cancer: No Review of Systems:   Pertinent items are noted in HPI Denies any headaches, blurred vision, fatigue, shortness of breath, chest pain, abdominal pain, abnormal vaginal discharge/itching/odor/irritation, problems with periods, bowel movements, urination, or intercourse unless otherwise stated above. Pertinent History Reviewed:  Reviewed past medical,surgical, social and family history.  Reviewed problem list, medications and allergies. Physical Assessment:   Vitals:   04/10/19 1001  BP: 136/87  Pulse: 87  Weight: 193 lb 12.8 oz (87.9 kg)  Height: 5\' 7"  (1.702 m)  Body mass index is 30.35 kg/m.  NEXPLANON REMOVAL  Time out was  performed.  Nexplanon site identified.  Area prepped in usual sterile fashon. One cc of 2% lidocaine was used to anesthetize the area at the distal end of the implant. A small stab incision was made right beside the implant on the distal portion.  The Nexplanon rod was grasped using hemostats and removed without difficulty.  There was less than 3 cc blood loss. There were no complications.  Steri-strips were applied over the small incision and a pressure bandage was applied.  The patient tolerated the procedure well.       Physical Examination:   General appearance - well appearing, and in no distress  Mental status - alert, oriented to person, place, and time  Psych:  She has a normal mood and affect  Skin - warm and dry, normal color, no suspicious lesions noted  Chest - effort normal, all lung fields clear to auscultation bilaterally  Heart - normal rate and regular rhythm  Neck:  midline trachea, no thyromegaly or nodules  Breasts - breasts appear normal, no suspicious masses, no skin or nipple changes or  axillary nodes  Abdomen - soft, nontender, nondistended, no masses or organomegaly  Pelvic - VULVA: normal appearing vulva with no masses, tenderness or lesions  VAGINA: normal appearing vagina with normal color and discharge, no lesions  CERVIX: normal appearing cervix without discharge or lesions, no CMT  Thin prep pap is done w/ HR HPV cotesting  UTERUS: uterus is felt to be normal size, shape, consistency and nontender   ADNEXA: No adnexal masses or tenderness noted.  Extremities:  No swelling or varicosities noted  Chaperone: Abby    Results for orders placed or performed in visit on 04/10/19 (from the past 24 hour(s))  POCT urine pregnancy   Collection Time: 04/10/19  9:30 AM  Result Value Ref Range   Preg Test, Ur Negative Negative    Assessment & Plan:  1) Well-Woman Exam  2) Nexplanon removal She was instructed to keep the area clean and dry, remove pressure bandage in  24 hours, and keep insertion site covered with the steri-strip for 3-5 days.   Follow-up PRN problems.  3) Contraception management> rx Junel, f/u , condoms x 2wks  4) Heavy discharge> since 29yo, her normal, can see if switching to COCs may help  Labs/procedures today: pap, nexplanon removal  Mammogram @29yo  or sooner if problems Colonoscopy @29yo  or sooner if problems  Orders Placed This Encounter  Procedures  . POCT urine pregnancy    Meds:  Meds ordered this encounter  Medications  . norethindrone-ethinyl estradiol (JUNEL FE 1/20) 1-20 MG-MCG tablet    Sig: Take 1 tablet by mouth daily.    Dispense:  1 Package    Refill:  11    Order Specific Question:   Supervising Provider    Answer:   [[id number]]    Follow-up: Return in about 3 months (around 07/10/2019) for F/U, MyChart Video, with me.  Lazaro Arms CNM, [facility] 04/10/2019 10:09 AM

## 2019-04-10 NOTE — Patient Instructions (Signed)
Condoms x 2wks   Oral Contraception Use Oral contraceptive pills (OCPs) are medicines that you take to prevent pregnancy. OCPs work by:  Preventing the ovaries from releasing eggs.  Thickening mucus in the lower part of the uterus (cervix), which prevents sperm from entering the uterus.  Thinning the lining of the uterus (endometrium), which prevents a fertilized egg from attaching to the endometrium. OCPs are highly effective when taken exactly as prescribed. However, OCPs do not prevent sexually transmitted infections (STIs). Safe sex practices, such as using condoms while on an OCP, can help prevent STIs. Before taking OCPs, you may have a physical exam, blood test, and Pap test. A Pap test involves taking a sample of cells from your cervix to check for cancer. Discuss with your health care provider the possible side effects of the OCP you may be prescribed. When you start an OCP, be aware that it can take 2-3 months for your body to adjust to changes in hormone levels. How to take oral contraceptive pills Follow instructions from your health care provider about how to start taking your first cycle of OCPs. Your health care provider may recommend that you:  Start the pill on day 1 of your menstrual period. If you start at this time, you will not need any backup form of birth control (contraception), such as condoms.  Start the pill on the first Sunday after your menstrual period or on the day you get your prescription. In these cases, you will need to use backup contraception for the first week.  Start the pill at any time of your cycle. ? If you take the pill within 5 days of the start of your period, you will not need a backup form of contraception. ? If you start at any other time of your menstrual cycle, you will need to use another form of contraception for 7 days. If your OCP is the type called a minipill, it will protect you from pregnancy after taking it for 2 days (48 hours), and  you can stop using backup contraception after that time. After you have started taking OCPs:  If you forget to take 1 pill, take it as soon as you remember. Take the next pill at the regular time.  If you miss 2 or more pills, call your health care provider. Different pills have different instructions for missed doses. Use backup birth control until your next menstrual period starts.  If you use a 28-day pack that contains inactive pills and you miss 1 of the last 7 pills (pills with no hormones), throw away the rest of the non-hormone pills and start a new pill pack. No matter which day you start the OCP, you will always start a new pack on that same day of the week. Have an extra pack of OCPs and a backup contraceptive method available in case you miss some pills or lose your OCP pack. Follow these instructions at home:  Do not use any products that contain nicotine or tobacco, such as cigarettes and e-cigarettes. If you need help quitting, ask your health care provider.  Always use a condom to protect against STIs. OCPs do not protect against STIs.  Use a calendar to mark the days of your menstrual period.  Read the information and directions that came with your OCP. Talk to your health care provider if you have questions. Contact a health care provider if:  You develop nausea and vomiting.  You have abnormal vaginal discharge or bleeding.  You develop a rash.  You miss your menstrual period. Depending on the type of OCP you are taking, this may be a sign of pregnancy. Ask your health care provider for more information.  You are losing your hair.  You need treatment for mood swings or depression.  You get dizzy when taking the OCP.  You develop acne after taking the OCP.  You become pregnant or think you may be pregnant.  You have diarrhea, constipation, and abdominal pain or cramps.  You miss 2 or more pills. Get help right away if:  You develop chest pain.  You  develop shortness of breath.  You have an uncontrolled or severe headache.  You develop numbness or slurred speech.  You develop visual or speech problems.  You develop pain, redness, and swelling in your legs.  You develop weakness or numbness in your arms or legs. Summary  Oral contraceptive pills (OCPs) are medicines that you take to prevent pregnancy.  OCPs do not prevent sexually transmitted infections (STIs). Always use a condom to protect against STIs.  When you start an OCP, be aware that it can take 2-3 months for your body to adjust to changes in hormone levels.  Read all the information and directions that come with your OCP. This information is not intended to replace advice given to you by your health care provider. Make sure you discuss any questions you have with your health care provider. Document Revised: 04/20/2018 Document Reviewed: 02/08/2016 Elsevier Patient Education  2020 [employer].

## 2019-04-12 LAB — CYTOLOGY - PAP
Comment: NEGATIVE
Diagnosis: NEGATIVE
High risk HPV: NEGATIVE

## 2019-04-13 ENCOUNTER — Other Ambulatory Visit: Payer: Self-pay | Admitting: Women's Health

## 2019-04-13 MED ORDER — METRONIDAZOLE 500 MG PO TABS: 500.0000 mg | ORAL_TABLET | Freq: Two times a day (BID) | ORAL | 0 refills | Status: DC

## 2019-04-13 MED FILL — METRONIDAZOLE 500 MG TABS: 500 | 7 days supply | Qty: 14 | Fill #0

## 2019-04-30 ENCOUNTER — Other Ambulatory Visit: Payer: No Typology Code available for payment source

## 2019-05-02 MED FILL — BLISOVI FE 1/20 1-20 MG-MCG: 1-20 | 84 days supply | Qty: 84 | Fill #1

## 2019-06-24 MED FILL — CITALOPRAM HBR 20 MG TABLET: 20 | 90 days supply | Qty: 90 | Fill #0

## 2019-07-09 ENCOUNTER — Telehealth: Payer: No Typology Code available for payment source | Admitting: Women's Health

## 2019-07-11 ENCOUNTER — Other Ambulatory Visit: Payer: Self-pay | Admitting: Adult Health

## 2019-07-11 MED ORDER — NORETHIN ACE-ETH ESTRAD-FE 1-20 MG-MCG PO TABS: 1.0000 | ORAL_TABLET | Freq: Every day | ORAL | 3 refills | Status: DC

## 2019-07-11 NOTE — Progress Notes (Signed)
Refill OCs 

## 2019-07-12 MED FILL — BLISOVI FE 1/20 1-20 MG-MCG: 1-20 | 84 days supply | Qty: 84 | Fill #0

## 2019-09-24 ENCOUNTER — Ambulatory Visit: Payer: Self-pay

## 2019-09-24 ENCOUNTER — Other Ambulatory Visit: Payer: Self-pay

## 2019-09-24 ENCOUNTER — Encounter: Payer: Self-pay | Admitting: Specialist

## 2019-09-24 ENCOUNTER — Ambulatory Visit: Payer: No Typology Code available for payment source | Admitting: Specialist

## 2019-09-24 ENCOUNTER — Other Ambulatory Visit: Payer: Self-pay | Admitting: Specialist

## 2019-09-24 VITALS — BP 136/88 | HR 86 | Ht 66.5 in | Wt 190.4 lb

## 2019-09-24 DIAGNOSIS — M545 Low back pain, unspecified: Secondary | ICD-10-CM

## 2019-09-24 DIAGNOSIS — M546 Pain in thoracic spine: Secondary | ICD-10-CM

## 2019-09-24 DIAGNOSIS — Z981 Arthrodesis status: Secondary | ICD-10-CM | POA: Diagnosis not present

## 2019-09-24 DIAGNOSIS — M5134 Other intervertebral disc degeneration, thoracic region: Secondary | ICD-10-CM

## 2019-09-24 MED ORDER — BIOFREEZE 4 % EX GEL: CUTANEOUS | 3 refills | Status: DC

## 2019-09-24 MED ORDER — CELECOXIB 200 MG PO CAPS: 200.0000 mg | ORAL_CAPSULE | Freq: Two times a day (BID) | ORAL | 2 refills | Status: DC

## 2019-09-24 MED ORDER — DICLOFENAC EPOLAMINE 1.3 % EX PTCH: 1.0000 | MEDICATED_PATCH | Freq: Two times a day (BID) | CUTANEOUS | 2 refills | Status: DC

## 2019-09-24 MED ORDER — METAXALONE 800 MG PO TABS: 800.0000 mg | ORAL_TABLET | Freq: Three times a day (TID) | ORAL | 3 refills | Status: DC

## 2019-09-24 MED FILL — DICLOFENAC EPOLAMINE 1.3 %: 1.3 | 30 days supply | Qty: 60 | Fill #0

## 2019-09-24 MED FILL — METAXALONE 800 MG TABS: 800 | 10 days supply | Qty: 30 | Fill #0

## 2019-09-24 MED FILL — CELECOXIB 200 MG CAP: 200 | 30 days supply | Qty: 60 | Fill #0

## 2019-09-24 NOTE — Progress Notes (Signed)
Office Visit Note   Patient: Shirley Peters           Date of Birth: 03-26-1990           MRN: [id number] Visit Date: 09/24/2019              Requested by: Elfredia Nevins, MD [address] [place],  [place] [address] PCP: Elfredia Nevins, MD   Assessment & Plan: Visit Diagnoses:  1. Low back pain, unspecified back pain laterality, unspecified chronicity, unspecified whether sciatica present   2. Pain in thoracic spine     Plan: Avoid frequent bending and stooping  No lifting greater than 10 lbs. May use ice or moist heat for pain. Weight loss is of benefit. Best medication for lumbar disc disease is arthritis medications like motrin and naprosyn. Exercise is important to improve your indurance and does allow people to function better inspite of back pain. CT scan of the thoracic fusion area ordered with marker. Use of a diclofenac patch locally and celebrex 200 mg po BID for antiinflamatory affect. Skelaxin for muscular pain. Biofreeze locally for mechanical and muscular pain.    Follow-Up Instructions: No follow-ups on file.   Orders:  Orders Placed This Encounter  Procedures  . XR Thoracic Spine 2 View  . XR Lumbar Spine 2-3 Views   No orders of the defined types were placed in this encounter.     Procedures: No procedures performed   Clinical Data: No additional findings.   Subjective: Chief Complaint  Patient presents with  . Spine - Pain    29 year old female with history of thoracolumbar fusion 17 years ago for idiopathic scoliosis, S shaped curve. Dr. Tomas[facility] Blase [facility] performed [facility]. No brace, 6 day hospital stay, done over the summer and returned to school. Had some restrictions. She has pain in the mid thoracic pain that radiates toward the right shoulder blade, pain has been present for years. More recently an episode of pain that was severe and radiated into the right shoulder blade and posteriorly mostly. No leg  pain numbness or paresthesias. No difficulty walking, could walk a mile, no stooping. Is using a back support that is a cushion in the car. There is pain with bending, lifting, stooping and whole house sweeping or mopping or vacuuming. No bowel or bladder difficulty. Bending and pulling and changing bed sheets is painful. There is pain when sleeping getting into and out of bed is difficulty and rolling over. Uses a heating pad and That helps. Lying flat aggravates the pain and lying on the left side with a pillow between the legs helps. Motrin or ibuprofen helps but it is Only temporary relief. 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 She was working 3 weeks ago and stretching and pulling sheets over while bent over a patient when an episode. She had immediate pain that is right of center at the mid to lower scapula level and right paracentral. Has been seen by Dr. Sherwood Gambler for this pain and was referred for evaluation and treatment.    Review of Systems  Constitutional: Negative.  Negative for activity change, appetite change, chills, diaphoresis, fatigue, fever and unexpected weight change.  HENT: Negative.  Negative for congestion, dental problem, drooling, ear discharge, ear pain, facial swelling, hearing loss, mouth sores, nosebleeds, postnasal drip, rhinorrhea, sinus pressure, sinus pain, sneezing, sore throat, tinnitus, trouble swallowing and voice change.   Eyes: Negative.  Negative for photophobia, pain, discharge, redness, itching and visual disturbance.  Respiratory: Positive for cough. Negative for apnea, choking, chest tightness, shortness of breath, wheezing and stridor.   Cardiovascular: Positive for palpitations and leg swelling. Negative for chest pain.  Gastrointestinal: Negative.  Negative for abdominal distention, abdominal pain, anal bleeding, blood in stool, constipation, diarrhea, nausea, rectal pain and vomiting.  Endocrine: Negative for cold  intolerance, heat intolerance, polydipsia, polyphagia and polyuria.  Genitourinary: Negative.  Negative for difficulty urinating, dyspareunia, dysuria, enuresis, flank pain and urgency.  Musculoskeletal: Positive for back pain. Negative for arthralgias, gait problem, joint swelling, myalgias, neck pain and neck stiffness.  Skin: Negative for color change, pallor, rash and wound.  Allergic/Immunologic: Positive for environmental allergies. Negative for food allergies and immunocompromised state.  Neurological: Positive for numbness (not new since in the mid back and right posterior distal upper arm). Negative for dizziness, tremors, seizures, syncope, facial asymmetry, speech difficulty, weakness, light-headedness and headaches.  Hematological: Negative.  Negative for adenopathy. Does not bruise/bleed easily.  Psychiatric/Behavioral: Negative.  Negative for agitation, behavioral problems, confusion, decreased concentration, dysphoric mood, hallucinations, self-injury, sleep disturbance and suicidal ideas. The patient is not nervous/anxious and is not hyperactive.      Objective: Vital Signs: BP 136/88 (BP Location: Left Arm, Patient Position: Sitting)   Pulse 86   Ht 5' 6.5" (1.689 m)   Wt 190 lb 6.4 oz (86.4 kg)   BMI 30.27 kg/m   Physical Exam Constitutional:      Appearance: She is well-developed.  HENT:     Head: Normocephalic and atraumatic.  Eyes:     Pupils: Pupils are equal, round, and reactive to light.  Pulmonary:     Effort: Pulmonary effort is normal.     Breath sounds: Normal breath sounds.  Abdominal:     General: Bowel sounds are normal.     Palpations: Abdomen is soft.  Musculoskeletal:        General: Normal range of motion.     Cervical back: Normal range of motion and neck supple.  Skin:    General: Skin is warm and dry.  Neurological:     Mental Status: She is alert and oriented to person, place, and time.  Psychiatric:        Behavior: Behavior normal.          Thought Content: Thought content normal.        Judgment: Judgment normal.     Back Exam   Tenderness  The patient is experiencing tenderness in the thoracic.      Specialty Comments:  No specialty comments available.  Imaging: No results found.   PMFS History: Patient Active Problem List   Diagnosis Date Noted  . Nexplanon insertion 08/24/2015  . Rectal bleeding 03/07/2012  . Melena 03/07/2012  . Abdominal pain, other specified site 03/07/2012   Past Medical History:  Diagnosis Date  . Scoliosis     Family History  Problem Relation Age of Onset  . Colon cancer Neg Hx     Past Surgical History:  Procedure Laterality Date  . BACK SURGERY     age 66  . WISDOM TOOTH EXTRACTION  Jan 2014   Social History   Occupational History  . 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 Smoking status: Current Some Day Smoker    Types: E-cigarettes  . Smokeless tobacco: Never Used  . Tobacco comment: 1 pack lasts a week  Substance and Sexual Activity  . Alcohol use: Yes  Comment: occ  . Drug use: No  . Sexual activity: Yes    Birth control/protection: Implant    Comment: irregular menses

## 2019-09-24 NOTE — Patient Instructions (Signed)
°  Plan: Avoid frequent bending and stooping  No lifting greater than 10 lbs. Ask for assistance with bariatric patient care.  May use ice or moist heat for pain. Weight loss is of benefit. Best medication for lumbar disc disease is arthritis medications like motrin and naprosyn. Exercise is important to improve your indurance and does allow people to function better inspite of back pain. CT scan of the thoracic fusion area ordered with marker. Use of a diclofenac patch locally and celebrex 200 mg po BID for antiinflamatory affect. Skelaxin for muscular pain. Biofreeze locally for mechanical and muscular pain.

## 2019-09-25 MED FILL — CITALOPRAM HBR 20 MG TABLET: 20 | 90 days supply | Qty: 90 | Fill #1

## 2019-10-04 ENCOUNTER — Other Ambulatory Visit: Payer: Self-pay

## 2019-10-04 ENCOUNTER — Ambulatory Visit (HOSPITAL_COMMUNITY)
Admission: RE | Admit: 2019-10-04 | Discharge: 2019-10-04 | Disposition: A | Discharge status: Home / Self Care | Payer: No Typology Code available for payment source | Source: Ambulatory Visit | Attending: Specialist | Admitting: Specialist

## 2019-10-04 DIAGNOSIS — M5134 Other intervertebral disc degeneration, thoracic region: Secondary | ICD-10-CM | POA: Diagnosis present

## 2019-10-04 DIAGNOSIS — Z981 Arthrodesis status: Secondary | ICD-10-CM | POA: Diagnosis present

## 2019-10-07 ENCOUNTER — Other Ambulatory Visit: Payer: Self-pay | Admitting: Specialist

## 2019-10-23 MED FILL — BLISOVI FE 1/20 1-20 MG-MCG: 1-20 | 84 days supply | Qty: 84 | Fill #1

## 2019-11-04 ENCOUNTER — Other Ambulatory Visit: Payer: Self-pay

## 2019-11-04 ENCOUNTER — Encounter: Payer: Self-pay | Admitting: Specialist

## 2019-11-04 ENCOUNTER — Ambulatory Visit (INDEPENDENT_AMBULATORY_CARE_PROVIDER_SITE_OTHER): Payer: No Typology Code available for payment source | Admitting: Specialist

## 2019-11-04 VITALS — BP 128/84 | HR 76 | Ht 66.5 in | Wt 191.0 lb

## 2019-11-04 DIAGNOSIS — M546 Pain in thoracic spine: Secondary | ICD-10-CM | POA: Diagnosis not present

## 2019-11-04 DIAGNOSIS — Z981 Arthrodesis status: Secondary | ICD-10-CM | POA: Diagnosis not present

## 2019-11-04 MED ORDER — LIDOCAINE HCL 1 % IJ SOLN
2.0000 mL | INTRAMUSCULAR | Status: AC | PRN
  Administered 2019-11-04: 2 mL

## 2019-11-04 MED ORDER — BUPIVACAINE HCL 0.5 % IJ SOLN
2.0000 mL | INTRAMUSCULAR | Status: AC | PRN
  Administered 2019-11-04: 2 mL

## 2019-11-04 MED ORDER — TRIAMCINOLONE ACETONIDE 40 MG/ML IJ SUSP
20.0000 mg | INTRAMUSCULAR | Status: AC | PRN
  Administered 2019-11-04: 20 mg via INTRAMUSCULAR

## 2019-11-04 NOTE — Patient Instructions (Addendum)
Plan: trigger injection right para thoracic spine at the T5 level Use Ice or heat locally for pain related to injection. Anesthetic should last about 5 hours, steroid should start to decrease inflamation and Scar tissue irritation of local nerves in 2-3 weeks.  Referral to [facility] therapy.

## 2019-11-04 NOTE — Progress Notes (Addendum)
   Office Visit Note   Patient: Shirley Peters           Date of Birth: [birth year]           MRN: [id number] Visit Date: 11/04/2019              Requested by: Elfredia Nevins, MD [address] [place],  [place] [address] PCP: Elfredia Nevins, MD   Assessment & Plan: Visit Diagnoses:  1. Trigger point of thoracic region   2. Pain in thoracic spine   3. Arthrodesis status     Plan: trigger injection right para thoracic spine at the T5 level Use Ice or heat locally for pain related to injection. Anesthetic should last about 5 hours, steroid should start to decrease inflamation and Scar tissue irritation of local nerves in 2-3 weeks.  Referral to AP therapy.   Follow-Up Instructions: Return in about 2 months (around 01/04/2020).   Orders:  No orders of the defined types were placed in this encounter.  No orders of the defined types were placed in this encounter.     Procedures: Trigger Point Inj  Date/Time: 11/04/2019 4:20 PM Performed by: Kerrin Champagne, MD Authorized by: Kerrin Champagne, MD   Site marked: the procedure site was marked   Indications:  Muscle spasm and pain Total # of Trigger Points:  1 Location: back   Needle Size:  25 G Approach:  Lateral Medications #1:  2 mL lidocaine 1 %; 2 mL bupivacaine 0.5 %; 20 mg triamcinolone acetonide 40 MG/ML Patient tolerance:  Patient tolerated the procedure well with no immediate complications     Clinical Data: No additional findings.   Subjective: Chief Complaint  Patient presents with  . Spine - Follow-up    CT Scan review    HPI  Review of Systems   Objective: Vital Signs: BP 128/84 (BP Location: Left Arm, Patient Position: Sitting)   Pulse 76   Ht 5' 6.5" (1.689 m)   Wt 191 lb (86.6 kg)   BMI 30.37 kg/m   Physical Exam  Ortho Exam  Specialty Comments:  No specialty comments available.  Imaging: No results found.   PMFS History: Patient Active Problem List   Diagnosis Date Noted    . Nexplanon insertion 08/24/2015  . Rectal bleeding 03/07/2012  . Melena 03/07/2012  . Abdominal pain, other specified site 03/07/2012   Past Medical History:  Diagnosis Date  . Scoliosis     Family History  Problem Relation Age of Onset  . Colon cancer Neg Hx     Past Surgical History:  Procedure Laterality Date  . BACK SURGERY     age 68  . WISDOM TOOTH EXTRACTION  Jan 2014   Social History   Occupational History  . 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 Smoking status: Current Some Day Smoker    Types: E-cigarettes  . Smokeless tobacco: Never Used  . Tobacco comment: 1 pack lasts a week  Substance and Sexual Activity  . Alcohol use: Yes    Comment: occ  . Drug use: No  . Sexual activity: Yes    Birth control/protection: Implant    Comment: irregular menses

## 2019-11-27 ENCOUNTER — Encounter (HOSPITAL_COMMUNITY): Payer: Self-pay | Admitting: Physical Therapy

## 2019-11-27 ENCOUNTER — Ambulatory Visit (HOSPITAL_COMMUNITY): Payer: No Typology Code available for payment source | Attending: Specialist | Admitting: Physical Therapy

## 2019-11-27 ENCOUNTER — Other Ambulatory Visit: Payer: Self-pay

## 2019-11-27 DIAGNOSIS — R293 Abnormal posture: Secondary | ICD-10-CM | POA: Diagnosis present

## 2019-11-27 DIAGNOSIS — M546 Pain in thoracic spine: Secondary | ICD-10-CM | POA: Diagnosis not present

## 2019-11-27 NOTE — Patient Instructions (Signed)
Access Code: [facility] URL: [email or web address] Date: 11/27/2019 Prepared by: Georges Lynch  Exercises Sidelying Open Book Thoracic Lumbar Rotation and Extension - 2 x daily - 7 x weekly - 1 sets - 10 reps - 5 sec hold Supine Scapular Retraction - 2 x daily - 7 x weekly - 2 sets - 10 reps - 5 sec holds hold

## 2019-11-27 NOTE — Therapy (Signed)
[facility] [address] [place], [place], [address] Phone: [phone]   Fax:  [phone]  Physical Therapy Evaluation  Patient Details  Name: Shirley Peters MRN: [id number] Date of Birth: 03-Oct-1990 Referring Provider (PT): Vira Browns MD    Encounter Date: 11/27/2019   PT End of Session - 11/27/19 1617    Visit Number 1    Number of Visits 8    Date for PT Re-Evaluation 12/27/19    Authorization Type  Focus (no VL, no auth)    PT Start Time 1430    PT Stop Time 1525    PT Time Calculation (min) 55 min    Activity Tolerance Patient tolerated treatment well    Behavior During Therapy [facility] for tasks assessed/performed           Past Medical History:  Diagnosis Date  . Scoliosis     Past Surgical History:  Procedure Laterality Date  . BACK SURGERY     age 85  . WISDOM TOOTH EXTRACTION  Jan 2014    There were no vitals filed for this visit.    Subjective Assessment - 11/27/19 1450    Subjective Patient presents to physical therapy with complaint of thoracic spine pain. Patient reports long standing history of pain in this area. Says she was diagnosed with scoliosis, and had corrective surgery when she was 11. Says pain has persisted to some degree. Says this has become worse recently. Describes pain about medial border of RT scapula and RT posterior hip area. Says she had injection in RT scapular area 2 weeks ago. Says this has not helped very much. Patient currently managing symptoms with prescribed muscle relaxer and anti inflammatory.    Pertinent History T1-L2 arthodesis (from childhood), scoliosis    Limitations Lifting;Standing;House hold activities    Patient Stated Goals Releive pain, tolerate more activity    Currently in Pain? Yes    Pain Score 1     Pain Location Scapula    Pain Orientation Right;Posterior    Pain Descriptors / Indicators Aching    Pain Type Chronic pain    Pain Onset More than a month  ago    Pain Frequency Constant    Aggravating Factors  lifitng, carrying, pulling, pushing, standing for long periods    Pain Relieving Factors Heat, rest, meds    Effect of Pain on Daily Activities Limits              [facility] PT Assessment - 11/27/19 0001      Assessment   Medical Diagnosis Trigger point of thoracic spine, thoracic spine pain     Referring Provider (PT) Vira Browns MD     Onset Date/Surgical Date --   Chronic   Prior Therapy Had around time of initial surgery (29 yo)      Precautions   Precautions None      Restrictions   Weight Bearing Restrictions No      Prior Function   Level of Independence Independent    Vocation Requirements Nurse Tech       Cognition   Overall Cognitive Status Within Functional Limits for tasks assessed      Observation/Other Assessments   Focus on Therapeutic Outcomes (FOTO)  Complete next session       Posture/Postural Control   Posture/Postural Control Postural limitations    Posture Comments LT lateral trunk shift, LT shoulder> RT, RT inferior scapular winging       ROM /  Strength   AROM / PROM / Strength AROM      AROM   Overall AROM Comments bilat shoulder AROM WFL;    AROM Assessment Site Thoracic    Thoracic Flexion 90% limited    Thoracic Extension 50% limited     Thoracic - Right Rotation 50% limited    Thoracic - Left Rotation 25% limited       Palpation   Palpation comment Mod TTP about RT medial scapular border (t4) RT SI joint                       Objective measurements completed on examination: See above findings.       [facility] Adult PT Treatment/Exercise - 11/27/19 0001      Exercises   Exercises Shoulder      Shoulder Exercises: Supine   Other Supine Exercises supine scap retraction 5 x5"       Shoulder Exercises: Stretch   Other Shoulder Stretches open book stretch (modified/ arms crossed) x5 each                  PT Education - 11/27/19 1456    Education Details on  evaluation findings, POC and HEP    Person(s) Educated Patient    Methods Explanation;Handout    Comprehension Verbalized understanding            PT Short Term Goals - 11/27/19 1622      PT SHORT TERM GOAL #1   Title Patient will be independent with initial HEP and self-management strategies to improve functional outcomes    Time 2    Period Weeks    Status New    Target Date 12/13/19             PT Long Term Goals - 11/27/19 1623      PT LONG TERM GOAL #1   Title Patient will improve FOTO score by 10% to indicate improvement in functional outcomes    Time 4    Period Weeks    Status New    Target Date 12/27/19      PT LONG TERM GOAL #2   Title Patient will report at least 70% overall improvement in subjective complaint to indicate improvement in ability to perform ADLs.    Time 4    Period Weeks    Status New    Target Date 12/27/19      PT LONG TERM GOAL #3   Title Patient will improve thoracic AROM by 25% in all restricted planes (excpet flexion) for improved ability to perform functional mobility tasks and ADLs.    Time 4    Period Weeks    Status New    Target Date 12/27/19                  Plan - 11/27/19 1618    Clinical Impression Statement Patient is a 29 y.o. female who presents to physical therapy with complaint of RT side thoracic pain. Patient demonstrates ROM restriction, reduced flexibility, increased tenderness to palpation and postural abnormalities which are likely contributing to symptoms of pain and are negatively impacting patient ability to perform ADLs. Patient will benefit from skilled physical therapy services to address these deficits to reduce pain and improve level of function with ADLs    Examination-Activity Limitations Reach Overhead;Lift;Carry;Caring for Others    Examination-Participation Restrictions Occupation;Cleaning;School    Stability/Clinical Decision Making Stable/Uncomplicated    Clinical Decision Making Low  Rehab Potential Good    PT Frequency 2x / week    PT Duration 4 weeks    PT Treatment/Interventions ADLs/Self Care Home Management;Aquatic Therapy;Biofeedback;Cryotherapy;Electrical Stimulation;Iontophoresis 4mg /ml Dexamethasone;Moist Heat;Traction;Balance training;Manual lymph drainage;Therapeutic exercise;Orthotic Fit/Training;Stair training;Functional mobility training;Therapeutic activities;Manual techniques;Vasopneumatic Device;Taping;Splinting;Energy conservation;Joint Manipulations;Dry needling;Passive range of motion;Patient/family education;DME Instruction;Contrast Bath;Fluidtherapy;Parrafin;Ultrasound;Neuromuscular re-education;Visual/perceptual remediation/compensation;Compression bandaging;Scar mobilization;Spinal Manipulations;Cognitive remediation    PT Next Visit Plan Complete FOTO. Review goals and HEP. Progress postural strengthening and mobility as tolerated with focus on thoracic spine. Address lumbar/ SI component as able. Manual STM for muscle pain and restriciton about RT scapula    PT Home Exercise Plan supine scapular retraction, modified open book t spine stretching    Consulted and Agree with Plan of Care Patient           Patient will benefit from skilled therapeutic intervention in order to improve the following deficits and impairments:     Visit Diagnosis: Pain in thoracic spine  Abnormal posture     Problem List Patient Active Problem List   Diagnosis Date Noted  . Nexplanon insertion 08/24/2015  . Rectal bleeding 03/07/2012  . Melena 03/07/2012  . Abdominal pain, other specified site 03/07/2012    4:35 PM, 11/27/19 11/29/19 PT DPT  Physical Therapist with [facility]  [address] [facility]  [phone]   [facility] [address] [place], [place], [place] Phone: [phone]   Fax:  [phone]  Name: Shirley Peters MRN: [place] Date of Birth: 1990/07/30

## 2019-12-02 ENCOUNTER — Encounter (HOSPITAL_COMMUNITY): Payer: Self-pay | Admitting: *Deleted

## 2019-12-02 ENCOUNTER — Emergency Department (HOSPITAL_COMMUNITY): Payer: No Typology Code available for payment source

## 2019-12-02 ENCOUNTER — Inpatient Hospital Stay (HOSPITAL_COMMUNITY)
Admission: EM | Admit: 2019-12-02 | Discharge: 2019-12-04 | DRG: 176 | Disposition: A | Discharge status: Home / Self Care | Payer: No Typology Code available for payment source | Source: Ambulatory Visit | Attending: Family Medicine | Admitting: Family Medicine

## 2019-12-02 ENCOUNTER — Other Ambulatory Visit: Payer: Self-pay

## 2019-12-02 ENCOUNTER — Ambulatory Visit
Admission: RE | Admit: 2019-12-02 | Discharge: 2019-12-02 | Disposition: A | Discharge status: Home / Self Care | Payer: No Typology Code available for payment source | Source: Ambulatory Visit | Attending: Internal Medicine | Admitting: Internal Medicine

## 2019-12-02 DIAGNOSIS — Z791 Long term (current) use of non-steroidal anti-inflammatories (NSAID): Secondary | ICD-10-CM

## 2019-12-02 DIAGNOSIS — I2699 Other pulmonary embolism without acute cor pulmonale: Principal | ICD-10-CM | POA: Diagnosis present

## 2019-12-02 DIAGNOSIS — F17290 Nicotine dependence, other tobacco product, uncomplicated: Secondary | ICD-10-CM | POA: Diagnosis present

## 2019-12-02 DIAGNOSIS — Z20822 Contact with and (suspected) exposure to covid-19: Secondary | ICD-10-CM | POA: Diagnosis present

## 2019-12-02 DIAGNOSIS — M419 Scoliosis, unspecified: Secondary | ICD-10-CM | POA: Diagnosis present

## 2019-12-02 DIAGNOSIS — Z880 Allergy status to penicillin: Secondary | ICD-10-CM

## 2019-12-02 DIAGNOSIS — Z8249 Family history of ischemic heart disease and other diseases of the circulatory system: Secondary | ICD-10-CM

## 2019-12-02 DIAGNOSIS — I2694 Multiple subsegmental pulmonary emboli without acute cor pulmonale: Secondary | ICD-10-CM

## 2019-12-02 DIAGNOSIS — Z79899 Other long term (current) drug therapy: Secondary | ICD-10-CM

## 2019-12-02 DIAGNOSIS — Z793 Long term (current) use of hormonal contraceptives: Secondary | ICD-10-CM

## 2019-12-02 DIAGNOSIS — R0600 Dyspnea, unspecified: Secondary | ICD-10-CM

## 2019-12-02 LAB — CBC
HCT: 41.6 % (ref 36.0–46.0)
Hemoglobin: 13.6 g/dL (ref 12.0–15.0)
MCH: 29.2 pg (ref 26.0–34.0)
MCHC: 32.7 g/dL (ref 30.0–36.0)
MCV: 89.5 fL (ref 80.0–100.0)
Platelets: 298 10*3/uL (ref 150–400)
RBC: 4.65 MIL/uL (ref 3.87–5.11)
RDW: 12.7 % (ref 11.5–15.5)
WBC: 9.7 10*3/uL (ref 4.0–10.5)
nRBC: 0 % (ref 0.0–0.2)

## 2019-12-02 LAB — COMPREHENSIVE METABOLIC PANEL
ALT: 17 U/L (ref 0–44)
AST: 15 U/L (ref 15–41)
Albumin: 3.9 g/dL (ref 3.5–5.0)
Alkaline Phosphatase: 58 U/L (ref 38–126)
Anion gap: 9 (ref 5–15)
BUN: 9 mg/dL (ref 6–20)
CO2: 25 mmol/L (ref 22–32)
Calcium: 8.7 mg/dL — ABNORMAL LOW (ref 8.9–10.3)
Chloride: 103 mmol/L (ref 98–111)
Creatinine, Ser: 0.47 mg/dL (ref 0.44–1.00)
GFR, Estimated: >60 mL/min (ref >60)
Glucose, Bld: 90 mg/dL (ref 70–99)
Potassium: 3.7 mmol/L (ref 3.5–5.1)
Sodium: 137 mmol/L (ref 135–145)
Total Bilirubin: 0.2 mg/dL — ABNORMAL LOW (ref 0.3–1.2)
Total Protein: 7.3 g/dL (ref 6.5–8.1)

## 2019-12-02 LAB — RESP PANEL BY RT-PCR (FLU A&B, COVID) ARPGX2
Influenza A by PCR: NEGATIVE
Influenza B by PCR: NEGATIVE
SARS Coronavirus 2 by RT PCR: NEGATIVE

## 2019-12-02 LAB — HCG, SERUM, QUALITATIVE: Preg, Serum: NEGATIVE

## 2019-12-02 LAB — D-DIMER, QUANTITATIVE: D-Dimer, Quant: 2.12 ug/mL-FEU — ABNORMAL HIGH (ref 0.00–0.50)

## 2019-12-02 MED ORDER — MORPHINE SULFATE (PF) 2 MG/ML IV SOLN
2.0000 mg | INTRAVENOUS | Status: DC | PRN
  Administered 2019-12-03: 2 mg via INTRAVENOUS
  Filled 2019-12-02: qty 1

## 2019-12-02 MED ORDER — ONDANSETRON HCL 4 MG/2ML IJ SOLN
4.0000 mg | Freq: Four times a day (QID) | INTRAMUSCULAR | Status: DC | PRN
  Administered 2019-12-02: 4 mg via INTRAVENOUS
  Filled 2019-12-02: qty 2

## 2019-12-02 MED ORDER — ACETAMINOPHEN 325 MG PO TABS
650.0000 mg | ORAL_TABLET | Freq: Four times a day (QID) | ORAL | Status: DC | PRN
  Administered 2019-12-03 – 2019-12-04 (×4): 650 mg via ORAL
  Filled 2019-12-02 (×5): qty 2

## 2019-12-02 MED ORDER — ONDANSETRON HCL 4 MG PO TABS: 4.0000 mg | ORAL_TABLET | Freq: Four times a day (QID) | ORAL | Status: DC | PRN

## 2019-12-02 MED ORDER — METAXALONE 800 MG PO TABS: 800.0000 mg | ORAL_TABLET | Freq: Three times a day (TID) | ORAL | Status: DC

## 2019-12-02 MED ORDER — POLYETHYLENE GLYCOL 3350 17 G PO PACK: 17.0000 g | PACK | Freq: Every day | ORAL | Status: DC | PRN

## 2019-12-02 MED ORDER — METAXALONE 800 MG PO TABS: 800.0000 mg | ORAL_TABLET | Freq: Three times a day (TID) | ORAL | Status: DC | PRN

## 2019-12-02 MED ORDER — ACETAMINOPHEN 650 MG RE SUPP: 650.0000 mg | Freq: Four times a day (QID) | RECTAL | Status: DC | PRN

## 2019-12-02 MED ORDER — HEPARIN SODIUM (PORCINE) 5000 UNIT/ML IJ SOLN
4000.0000 [IU] | Freq: Once | INTRAMUSCULAR | Status: AC
  Administered 2019-12-02: 4000 [IU] via INTRAVENOUS
  Filled 2019-12-02: qty 1

## 2019-12-02 MED ORDER — OXYCODONE HCL 5 MG PO TABS
5.0000 mg | ORAL_TABLET | ORAL | Status: DC | PRN
  Administered 2019-12-03 – 2019-12-04 (×3): 5 mg via ORAL
  Filled 2019-12-02 (×3): qty 1

## 2019-12-02 MED ORDER — ADULT MULTIVITAMIN W/MINERALS CH
1.0000 | ORAL_TABLET | Freq: Every day | ORAL | Status: DC
  Administered 2019-12-02 – 2019-12-04 (×3): 1 via ORAL
  Filled 2019-12-02 (×3): qty 1

## 2019-12-02 MED ORDER — IOHEXOL 350 MG/ML SOLN
100.0000 mL | Freq: Once | INTRAVENOUS | Status: AC | PRN
  Administered 2019-12-02: 100 mL via INTRAVENOUS

## 2019-12-02 MED ORDER — HEPARIN (PORCINE) 25000 UT/250ML-% IV SOLN
1650.0000 [IU]/h | INTRAVENOUS | Status: DC
  Administered 2019-12-02: 1350 [IU]/h via INTRAVENOUS
  Administered 2019-12-03: 1500 [IU]/h via INTRAVENOUS
  Administered 2019-12-04: 1650 [IU]/h via INTRAVENOUS
  Filled 2019-12-02 (×4): qty 250

## 2019-12-02 MED ORDER — CELECOXIB 100 MG PO CAPS: 200.0000 mg | ORAL_CAPSULE | Freq: Two times a day (BID) | ORAL | Status: DC

## 2019-12-02 NOTE — ED Triage Notes (Signed)
Patient is being discharged from the Urgent Care and sent to the Emergency Department via private vehicle. Per B Wurst , patient is in need of higher level of care to rule out PE. Pt states her mom passed away of PE at 37  Patient is aware and verbalizes understanding of plan of care.  Vitals:   12/02/19 1302  BP: 129/86  Pulse: 96  Resp: 18  Temp: 98.1 F (36.7 C)  SpO2: 96%

## 2019-12-02 NOTE — Progress Notes (Signed)
ANTICOAGULATION CONSULT NOTE - Initial Consult  Pharmacy Consult for Heparin Indication: pulmonary embolus  Allergies  Allergen Reactions  . Penicillins     Patient doesn't know her reaction, states that she has been told she is allergic since she was young    Patient Measurements: Height: 5\' 7"  (170.2 cm) Weight: 85.7 kg (189 lb) IBW/kg (Calculated) : 61.6 Heparin Dosing Weight: 80 kg  Vital Signs: Temp: 98.3 F (36.8 C) (11/22 1328) Temp Source: Oral (11/22 1328) BP: 128/79 (11/22 1830) Pulse Rate: 97 (11/22 1830)  Labs: Recent Labs    12/02/19 1346  HGB 13.6  HCT 41.6  PLT 298  CREATININE 0.47    Estimated Creatinine Clearance: 114.5 mL/min (by C-G formula based on SCr of 0.47 mg/dL).   Medical History: Past Medical History:  Diagnosis Date  . Scoliosis    Assessment:  29 yr old female to begin IV heparin for pulmonary embolus per CTA.  Duplex pending.  Noted on OCPs and mother had PE during pregnancy.     Heparin 4000 units IV bolus given.   Goal of Therapy:  Heparin level 0.3-0.7 units/ml Monitor platelets by anticoagulation protocol: Yes   Plan:   Heparin drip to begin at 1350 units/hr   Heparin level ~6 hours after infusion started.  Daily heparin level and CBC while on heparin.  38, RPh Phone: [phone] 12/02/2019,6:48 PM

## 2019-12-02 NOTE — Progress Notes (Signed)
Pt c/o sharp sudden chest pain in the sternal region. Patient also stated she felt really hot and nauseated. BP elevated at this time. Anna Genre, NP notified via secure chat with no new orders at this time.

## 2019-12-02 NOTE — ED Provider Notes (Signed)
[facility] EMERGENCY DEPARTMENT Provider Note   CSN: [id number] Arrival date & time: 12/02/19  1315     History Chief Complaint  Patient presents with   Shortness of Breath    Shirley Peters is a 29 y.o. female.  HPI  29 yo female presents today complaining of increasing dyspnea over months now worse x several days.  Dyspnea began on exertion, but now is present at rest.  She has no personal history of dvt or pe, but her mother had a pe at 75.  The patient is on bcp.  She notes no lateralized leg swelling.  She is vaccinated against covid and does not have cough or fever. She had been vaping but has stopped.     Past Medical History:  Diagnosis Date   Scoliosis     Patient Active Problem List   Diagnosis Date Noted   Nexplanon insertion 08/24/2015   Rectal bleeding 03/07/2012   Melena 03/07/2012   Abdominal pain, other specified site 03/07/2012    Past Surgical History:  Procedure Laterality Date   BACK SURGERY     age 53   WISDOM TOOTH EXTRACTION  Jan 2014     OB History   No obstetric history on file.     Family History  Problem Relation Age of Onset   Colon cancer Neg Hx     Social History   Tobacco Use   Smoking status: Current Some Day Smoker    Types: E-cigarettes   Smokeless tobacco: Never Used   Tobacco comment: 1 pack lasts a week  Substance Use Topics   Alcohol use: Yes    Comment: occ   Drug use: No    Home Medications Prior to Admission medications   Medication Sig Start Date End Date Taking? Authorizing Provider  celecoxib (CELEBREX) 200 MG capsule Take 1 capsule (200 mg total) by mouth 2 (two) times daily. 09/24/19   Kerrin Champagne, MD  diclofenac (FLECTOR) 1.3 % PTCH Place 1 patch onto the skin 2 (two) times daily. 09/24/19   Kerrin Champagne, MD  Menthol, Topical Analgesic, (BIOFREEZE) 4 % GEL May roll on to area of discomfort every 4 hours as needed for pain. 09/24/19   Kerrin Champagne, MD  metaxalone (SKELAXIN) 800 MG  tablet Take 1 tablet (800 mg total) by mouth 3 (three) times daily. 09/24/19   Kerrin Champagne, MD  norethindrone-ethinyl estradiol (JUNEL FE 1/20) 1-20 MG-MCG tablet Take 1 tablet by mouth daily. 07/11/19   Adline Potter, NP    Allergies    Penicillins  Review of Systems   Review of Systems  All other systems reviewed and are negative.   Physical Exam Updated Vital Signs BP 139/89    Pulse 92    Temp 98.3 F (36.8 C) (Oral)    Resp 20    Ht 1.702 m (5\' 7" )    Wt 83.9 kg    LMP 11/11/2019 (Approximate)    SpO2 96%    BMI 28.98 kg/m   Physical Exam Vitals and nursing note reviewed.  Constitutional:      Appearance: She is well-developed.     Comments: Appears slightly dyspneic while conversing  HENT:     Head: Normocephalic.     Mouth/Throat:     Mouth: Mucous membranes are moist.  Eyes:     Pupils: Pupils are equal, round, and reactive to light.  Cardiovascular:     Rate and Rhythm: Normal rate and regular rhythm.  Pulmonary:     Effort: Pulmonary effort is normal.  Abdominal:     Palpations: Abdomen is soft.  Musculoskeletal:        General: Normal range of motion.     Cervical back: Normal range of motion and neck supple.  Skin:    General: Skin is warm and dry.     Capillary Refill: Capillary refill takes less than 2 seconds.  Neurological:     General: No focal deficit present.     Mental Status: She is alert.  Psychiatric:        Mood and Affect: Mood normal.        Behavior: Behavior normal.     ED Results / Procedures / Treatments   Labs (all labs ordered are listed, but only abnormal results are displayed) Labs Reviewed  COMPREHENSIVE METABOLIC PANEL - Abnormal; Notable for the following components:      Result Value   Calcium 8.7 (*)    Total Bilirubin 0.2 (*)    All other components within normal limits  RESP PANEL BY RT-PCR (FLU A&B, COVID) ARPGX2  CBC  HCG, SERUM, QUALITATIVE  D-DIMER, QUANTITATIVE (NOT AT [facility])    EKG EKG  Interpretation  Date/Time:  Monday December 02 2019 13:27:11 EST Ventricular Rate:  89 PR Interval:    QRS Duration: 69 QT Interval:  344 QTC Calculation: 419 R Axis:   90 Text Interpretation: Sinus rhythm Biatrial enlargement Borderline right axis deviation Confirmed by Margarita Grizzle [phone]) on 12/02/2019 2:17:17 PM   Radiology DG Chest Port 1 View  Result Date: 12/02/2019 CLINICAL DATA:  Dyspnea with exertion for the past 2 weeks EXAM: PORTABLE CHEST 1 VIEW COMPARISON:  01/07/2010 FINDINGS: Mild lingular atelectasis. No focal consolidation. No pleural effusion or pneumothorax. Stable cardiomegaly. No acute osseous abnormality. S-shaped scoliosis of the thoracolumbar spine with posterior spinal fixation hardware present. IMPRESSION: No active disease. Electronically Signed   By: Elige Ko   On: 12/02/2019 14:19    Procedures Procedures (including critical care time)  Medications Ordered in ED Medications - No data to display  ED Course  I have reviewed the triage vital signs and the nursing notes.  Pertinent labs & imaging results that were available during my care of the patient were reviewed by me and considered in my medical decision making (see chart for details).    MDM Rules/Calculators/A&P                          Patient with dyspnea and family history of pe.  Patient with mildly elevated d-dimer.  Discussed with Dr. Deretha Emory who will follow up and refer patient if cta negative.     Final Clinical Impression(s) / ED Diagnoses Final diagnoses:  Dyspnea, unspecified type    Rx / DC Orders ED Discharge Orders    None       Margarita Grizzle, MD 12/02/19 [phone]

## 2019-12-02 NOTE — ED Provider Notes (Signed)
Patient with feeling of shortness of breath yesterday.  On November 6 did have a period of chest pain that resolved after drinking Pepto-Bismol.  CT angio today shows evidence of a significant right-sided pulmonary embolus without evidence of any right heart strain.  There is not a saddle embolus.  Patient will be started on heparin we will discuss with hospitalist for admission.  Patient is on birth control pills.  Patient's mother had a significant pulmonary embolus during pregnancy.  Patient is hemodynamically stable.  Patient's oxygen saturations are good she is not tachycardic oxygen saturation is in upper 90s on room air.  Heart rate around 84-85.  CRITICAL CARE Performed by: Vanetta Mulders Total critical care time: 35 minutes Critical care time was exclusive of separately billable procedures and treating other patients. Critical care was necessary to treat or prevent imminent or life-threatening deterioration. Critical care was time spent personally by me on the following activities: development of treatment plan with patient and/or surrogate as well as nursing, discussions with consultants, evaluation of patient's response to treatment, examination of patient, obtaining history from patient or surrogate, ordering and performing treatments and interventions, ordering and review of laboratory studies, ordering and review of radiographic studies, pulse oximetry and re-evaluation of patient's condition.    Vanetta Mulders, MD 12/02/19 [phone]

## 2019-12-02 NOTE — ED Triage Notes (Signed)
Pt c/o SOB x 3 months, worsening yesterday. Pt also reports mid chest pain that radiated up to the neck on 11/6. Pt took some Pepto and the pain completely resolved. No chest pain since. Pt reports her HR increases a lot when walking. Yesterday HR got up to 145bpm while walking at work. Pt reports the SOB is worse with exertion.

## 2019-12-02 NOTE — ED Triage Notes (Signed)
Pt states she has experienced sob with exertion for past 2 weeks. Pt states she experienced chest pain once last week that resolved with pepto bismoth . Pt states sob only with exertion and also becomes tachycardic

## 2019-12-02 NOTE — H&P (Signed)
TRH H&P    Patient Demographics:    Shirley Peters, is a 29 y.o. female  MRN: [id number]  DOB - October 11, 1990  Admit Date - 12/02/2019  Referring MD/NP/PA: Deretha Emory  Outpatient Primary MD for the patient is Elfredia Nevins, MD  Patient coming from: Home  Chief complaint- Dyspnea   HPI:    Shirley Peters  is a 29 y.o. female, with history of scoliosis for which she is in PT, presents to the ER with a chief complaint of dyspnea.  Patient reports that she is actually had intermittent dyspnea for 3 months.  On November 6 she had chest pain that she describes as intense, severe, squeezing.  It radiated to her neck, she became nauseous.  The pain itself was located in the middle of her chest.  Patient reports that she drank a bottle of Pepto-Bismol and the pain went away so she did not think much of it afterwards.  Then yesterday in the morning she felt palpitations and worsening dyspnea.  She reports both the palpitations and dyspnea were worse with exertion, better with rest.  Through the night last night palpitations woke her from sleep several times.  She called her PCP today who advised her to go to be evaluated.  Patient went to urgent care and urgent care sent her to the ER.   She has never had this before.  Patient reports that 3 months ago she switched from Nexplanon to OCPs so she could start planning to try for another child.  She had no shortness of breath or any problems with Nexplanon.  Patient reports that her mother had a PE when she was 39 years old during pregnancy.  Patient currently smokes electronic cigarette.  She does not use any illicit drugs.  Patient is full code.  In the ED Temp 98.1, heart rate 80-96, respiratory rate 18-21, blood pressure 119/83, satting at 97% White blood cell count was 9.7, hemoglobin 13.7 Chemistry panel is unremarkable Pregnancy test negative Negative Covid and flu CTA shows  right pulmonary artery embolism without evidence of saddle or heart strain Chest x-ray shows no active disease D-dimer 2 Heparin started Patient admitted for PE work-up    Review of systems:    In addition to the HPI above,  No Fever-chills, No Headache, No changes with Vision or hearing, No problems swallowing food or Liquids, Admits to chest pain, and shortness of Breath, but no cough No Abdominal pain, No Vomiting, bowel movements are regular, No Blood in stool or Urine, No dysuria, No new skin rashes or bruises, No new joints pains-aches,  No new weakness, tingling, numbness in any extremity, No recent weight gain or loss, No polyuria, polydypsia or polyphagia, No significant Mental Stressors.  All other systems reviewed and are negative.    Past History of the following :    Past Medical History:  Diagnosis Date  . Scoliosis       Past Surgical History:  Procedure Laterality Date  . BACK SURGERY     age 29  . WISDOM TOOTH EXTRACTION  Jan 2014      Social History:      Social History   Tobacco Use  . Smoking status: Current Some Day Smoker    Types: E-cigarettes  . Smokeless tobacco: Never Used  . Tobacco comment: 1 pack lasts a week  Substance Use Topics  . Alcohol use: Yes    Comment: occ       Family History :     Family History  Problem Relation Age of Onset  . Colon cancer Neg Hx       Home Medications:   Prior to Admission medications   Medication Sig Start Date End Date Taking? Authorizing Provider  celecoxib (CELEBREX) 200 MG capsule Take 1 capsule (200 mg total) by mouth 2 (two) times daily. 09/24/19  Yes Kerrin Champagne, MD  diclofenac (FLECTOR) 1.3 % PTCH Place 1 patch onto the skin 2 (two) times daily. 09/24/19  Yes Kerrin Champagne, MD  ibuprofen (ADVIL) 200 MG tablet Take 800 mg by mouth every 6 (six) hours as needed for moderate pain.   Yes [provider]  norethindrone-ethinyl estradiol (JUNEL FE 1/20) 1-20 MG-MCG  tablet Take 1 tablet by mouth daily. 07/11/19  Yes Adline Potter, NP  Azithromycin (ZITHROMAX Z-PAK PO)  10/19/16   [provider]  citalopram (CELEXA) 20 MG tablet Take 20 mg by mouth daily. 09/25/19   [provider]  Menthol, Topical Analgesic, (BIOFREEZE) 4 % GEL May roll on to area of discomfort every 4 hours as needed for pain. Patient not taking: Reported on 12/02/2019 09/24/19   Kerrin Champagne, MD  metaxalone (SKELAXIN) 800 MG tablet Take 1 tablet (800 mg total) by mouth 3 (three) times daily. Patient not taking: Reported on 12/02/2019 09/24/19   Kerrin Champagne, MD     Allergies:     Allergies  Allergen Reactions  . Penicillins     Patient doesn't know her reaction, states that she has been told she is allergic since she was young     Physical Exam:   Vitals  Blood pressure (!) 137/91, pulse 90, temperature 98.3 F (36.8 C), temperature source Oral, resp. rate 20, height 5\' 7"  (1.702 m), weight 85.7 kg, last menstrual period 11/11/2019, SpO2 99 %.  1.  General: Patient sitting up in bed crosslegged in no acute distress  2. Psychiatric: Mood and behavior normal for situation, cooperative with exam, alert and oriented x3  3. Neurologic: Cranial nerves II through XII intact, no focal deficit on limited exam, and speech and language normal, moves all 4 extremities voluntarily  4. HEENMT:  Head is atraumatic, normocephalic, pupils are reactive to light, neck is supple, trachea is midline, mucous membranes are moist  5. Respiratory : Lungs are clear to auscultation bilaterally, no wheezes, no rhonchi, no crackles, no cyanosis, no clubbing  6. Cardiovascular : Heart rate is tachycardic, rhythm is regular, no murmurs rubs or gallops  7. Gastrointestinal:  Abdomen is soft, nondistended, nontender to palpation  8. Skin:  No acute lesions on limited skin exam  9.Musculoskeletal:  No peripheral edema or unilateral leg swelling, no acute deformity, no  calf tenderness    Data Review:    CBC Recent Labs  Lab 12/02/19 1346  WBC 9.7  HGB 13.6  HCT 41.6  PLT 298  MCV 89.5  MCH 29.2  MCHC 32.7  RDW 12.7   ------------------------------------------------------------------------------------------------------------------  Results for orders placed or performed during the hospital encounter of 12/02/19 (from the past 48 hour(s))  CBC     Status: None   Collection Time: 12/02/19  1:46 PM  Result Value Ref Range   WBC 9.7 4.0 - 10.5 K/uL   RBC 4.65 3.87 - 5.11 MIL/uL   Hemoglobin 13.6 12.0 - 15.0 g/dL   HCT 96.0 36 - 46 %   MCV 89.5 80.0 - 100.0 fL   MCH 29.2 26.0 - 34.0 pg   MCHC 32.7 30.0 - 36.0 g/dL   RDW 45.4 09.8 - 11.9 %   Platelets 298 150 - 400 K/uL   nRBC 0.0 0.0 - 0.2 %    Comment: Performed at [facility], [address] [place], [place] [address]  Comprehensive metabolic panel     Status: Abnormal   Collection Time: 12/02/19  1:46 PM  Result Value Ref Range   Sodium 137 135 - 145 mmol/L   Potassium 3.7 3.5 - 5.1 mmol/L   Chloride 103 98 - 111 mmol/L   CO2 25 22 - 32 mmol/L   Glucose, Bld 90 70 - 99 mg/dL    Comment: Glucose reference range applies only to samples taken after fasting for at least 8 hours.   BUN 9 6 - 20 mg/dL   Creatinine, Ser 9.56 0.44 - 1.00 mg/dL   Calcium 8.7 (L) 8.9 - 10.3 mg/dL   Total Protein 7.3 6.5 - 8.1 g/dL   Albumin 3.9 3.5 - 5.0 g/dL   AST 15 15 - 41 U/L   ALT 17 0 - 44 U/L   Alkaline Phosphatase 58 38 - 126 U/L   Total Bilirubin 0.2 (L) 0.3 - 1.2 mg/dL   GFR, Estimated >21 >30 mL/min    Comment: (NOTE) Calculated using the CKD-EPI Creatinine Equation (2021)    Anion gap 9 5 - 15    Comment: Performed at [facility], [address], [place], [place] [address]  D-dimer, quantitative (not at [facility])     Status: Abnormal   Collection Time: 12/02/19  1:46 PM  Result Value Ref Range   D-Dimer, Quant 2.12 (H) 0.00 - 0.50 ug/mL-FEU    Comment: (NOTE) At the manufacturer cut-off  value of 0.5 g/mL FEU, this assay has a negative predictive value of 95-100%.This assay is intended for use in conjunction with a clinical pretest probability (PTP) assessment model to exclude pulmonary embolism (PE) and deep venous thrombosis (DVT) in outpatients suspected of PE or DVT. Results should be correlated with clinical presentation. Performed at [facility], [address] [place], [place] [address]   hCG, serum, qualitative     Status: None   Collection Time: 12/02/19  2:03 PM  Result Value Ref Range   Preg, Serum NEGATIVE NEGATIVE    Comment:        THE SENSITIVITY OF THIS METHODOLOGY IS >10 mIU/mL. Performed at [facility], [address] [place], [place] [address]   Resp Panel by RT-PCR (Flu A&B, Covid) Nasopharyngeal Swab     Status: None   Collection Time: 12/02/19  2:31 PM   Specimen: Nasopharyngeal Swab; Nasopharyngeal(NP) swabs in vial transport medium  Result Value Ref Range   SARS Coronavirus 2 by RT PCR NEGATIVE NEGATIVE    Comment: (NOTE) SARS-CoV-2 target nucleic acids are NOT DETECTED.  The SARS-CoV-2 RNA is generally detectable in upper respiratory specimens during the acute phase of infection. The lowest concentration of SARS-CoV-2 viral copies this assay can detect is 138 copies/mL. A negative result does not preclude SARS-Cov-2 infection and should not be used as the sole basis for  treatment or other patient management decisions. A negative result may occur with  improper specimen collection/handling, submission of specimen other than nasopharyngeal swab, presence of viral mutation(s) within the areas targeted by this assay, and inadequate number of viral copies(<138 copies/mL). A negative result must be combined with clinical observations, patient history, and epidemiological information. The expected result is Negative.  Fact Sheet for Patients:  BloggerCourse.com  Fact Sheet for Healthcare Providers:    SeriousBroker.it  This test is no t yet approved or cleared by the [place] FDA and  has been authorized for detection and/or diagnosis of SARS-CoV-2 by FDA under an Emergency Use Authorization (EUA). This EUA will remain  in effect (meaning this test can be used) for the duration of the COVID-19 declaration under Section 564(b)(1) of the Act, 21 U.S.C.section 360bbb-3(b)(1), unless the authorization is terminated  or revoked sooner.       Influenza A by PCR NEGATIVE NEGATIVE   Influenza B by PCR NEGATIVE NEGATIVE    Comment: (NOTE) The Xpert Xpress SARS-CoV-2/FLU/RSV plus assay is intended as an aid in the diagnosis of influenza from Nasopharyngeal swab specimens and should not be used as a sole basis for treatment. Nasal washings and aspirates are unacceptable for Xpert Xpress SARS-CoV-2/FLU/RSV testing.  Fact Sheet for Patients: BloggerCourse.com  Fact Sheet for Healthcare Providers: SeriousBroker.it  This test is not yet approved or cleared by the [place] FDA and has been authorized for detection and/or diagnosis of SARS-CoV-2 by FDA under an Emergency Use Authorization (EUA). This EUA will remain in effect (meaning this test can be used) for the duration of the COVID-19 declaration under Section 564(b)(1) of the Act, 21 U.S.C. section 360bbb-3(b)(1), unless the authorization is terminated or revoked.  Performed at [facility], [address] [place], [place] [address]     Chemistries  Recent Labs  Lab 12/02/19 1346  NA 137  K 3.7  CL 103  CO2 25  GLUCOSE 90  BUN 9  CREATININE 0.47  CALCIUM 8.7*  AST 15  ALT 17  ALKPHOS 58  BILITOT 0.2*    ------------------------------------------------------------------------------------------------------------------  ------------------------------------------------------------------------------------------------------------------ GFR: Estimated Creatinine Clearance: 114.5 mL/min (by C-G formula based on SCr of 0.47 mg/dL). Liver Function Tests: Recent Labs  Lab 12/02/19 1346  AST 15  ALT 17  ALKPHOS 58  BILITOT 0.2*  PROT 7.3  ALBUMIN 3.9   No results for input(s): LIPASE, AMYLASE in the last 168 hours. No results for input(s): AMMONIA in the last 168 hours. Coagulation Profile: No results for input(s): INR, PROTIME in the last 168 hours. Cardiac Enzymes: No results for input(s): CKTOTAL, CKMB, CKMBINDEX, TROPONINI in the last 168 hours. BNP (last 3 results) No results for input(s): PROBNP in the last 8760 hours. HbA1C: No results for input(s): HGBA1C in the last 72 hours. CBG: No results for input(s): GLUCAP in the last 168 hours. Lipid Profile: No results for input(s): CHOL, HDL, LDLCALC, TRIG, CHOLHDL, LDLDIRECT in the last 72 hours. Thyroid Function Tests: No results for input(s): TSH, T4TOTAL, FREET4, T3FREE, THYROIDAB in the last 72 hours. Anemia Panel: No results for input(s): VITAMINB12, FOLATE, FERRITIN, TIBC, IRON, RETICCTPCT in the last 72 hours.  --------------------------------------------------------------------------------------------------------------- Urine analysis: No results found for: COLORURINE, APPEARANCEUR, LABSPEC, PHURINE, GLUCOSEU, HGBUR, BILIRUBINUR, KETONESUR, PROTEINUR, UROBILINOGEN, NITRITE, LEUKOCYTESUR    Imaging Results:    CT Angio Chest PE W and/or Wo Contrast  Addendum Date: 12/02/2019   ADDENDUM REPORT: 12/02/2019 17:56 ADDENDUM: Results were discussed with Dr. Royston Sinner at 5:22 p.m. [place]  on December 02, 2019. Electronically Signed   By: Aram Candela M.D.   On: 12/02/2019 17:56   Result Date: 12/02/2019 CLINICAL DATA:   Shortness of breath. EXAM: CT ANGIOGRAPHY CHEST WITH CONTRAST TECHNIQUE: Multidetector CT imaging of the chest was performed using the standard protocol during bolus administration of intravenous contrast. Multiplanar CT image reconstructions and MIPs were obtained to evaluate the vascular anatomy. CONTRAST:  OMNIPAQUE IOHEXOL 350 MG/ML SOLN COMPARISON:  None. FINDINGS: Cardiovascular: A marked amount of intraluminal low attenuation is seen within the right pulmonary artery. Extension to involve multiple upper lobe, middle lobe and lower lobe branches is seen. There is no evidence of saddle embolus. Normal heart size without evidence of heart strain. No pericardial effusion. Mediastinum/Nodes: No enlarged mediastinal, hilar, or axillary lymph nodes. Thyroid gland, trachea, and esophagus demonstrate no significant findings. Lungs/Pleura: Lungs are clear. No pleural effusion or pneumothorax. Upper Abdomen: No acute abnormality. Musculoskeletal: There is moderate severity dextroscoliosis of the thoracic spine. Metallic density pedicle screws are seen throughout the thoracic spine. Review of the MIP images confirms the above findings. IMPRESSION: Marked severity pulmonary embolism within the right pulmonary artery without evidence of saddle embolus or heart strain. Electronically Signed: By: Aram Candela M.D. On: 12/02/2019 17:15   DG Chest Port 1 View  Result Date: 12/02/2019 CLINICAL DATA:  Dyspnea with exertion for the past 2 weeks EXAM: PORTABLE CHEST 1 VIEW COMPARISON:  01/07/2010 FINDINGS: Mild lingular atelectasis. No focal consolidation. No pleural effusion or pneumothorax. Stable cardiomegaly. No acute osseous abnormality. S-shaped scoliosis of the thoracolumbar spine with posterior spinal fixation hardware present. IMPRESSION: No active disease. Electronically Signed   By: Elige Ko   On: 12/02/2019 14:19    My personal review of EKG: Rhythm NSR, Rate 89 /min, QTc 419 ,no Acute ST  changes   Assessment & Plan:    Active Problems:   Pulmonary embolus (HCC)   1. Pulmonary embolism 1. Right-sided pulmonary embolism 2. Heparin started 3. Likely provoked by estrogen and tobacco use 4. Echo in the a.m. to evaluate for right heart strain 5. Ultrasound bilateral legs for clot burden 6. Monitor on telemetry 2. Scoliosis 1. Continue muscle relaxer 3. Tobacco use disorder 1. Counseled on cessation   DVT Prophylaxis-   Heparin- SCDs   AM Labs Ordered, also please review Full Orders  Family Communication: No family at bedside Code Status: Full  Admission status: Inpatient :The appropriate admission status for this patient is INPATIENT. Inpatient status is judged to be reasonable and necessary in order to provide the required intensity of service to ensure the patient's safety. The patient's presenting symptoms, physical exam findings, and initial radiographic and laboratory data in the context of their chronic comorbidities is felt to place them at high risk for further clinical deterioration. Furthermore, it is not anticipated that the patient will be medically stable for discharge from the hospital within 2 midnights of admission. The following factors support the admission status of inpatient.     The patient's presenting symptoms include dyspnea The worrisome physical exam findings include tachycardia up to 109 The initial radiographic and laboratory data are worrisome because of pulmonary embolism The chronic co-morbidities include scoliosis     * I certify that at the point of admission it is my clinical judgment that the patient will require inpatient hospital care spanning beyond 2 midnights from the point of admission due to high intensity of service, high risk for further deterioration and high frequency of surveillance required.*  Time spent in minutes : 60   Bricyn Labrada B Zierle-Ghosh DO

## 2019-12-03 ENCOUNTER — Inpatient Hospital Stay (HOSPITAL_COMMUNITY): Payer: No Typology Code available for payment source

## 2019-12-03 ENCOUNTER — Encounter (HOSPITAL_COMMUNITY): Payer: Self-pay | Admitting: Family Medicine

## 2019-12-03 DIAGNOSIS — I2699 Other pulmonary embolism without acute cor pulmonale: Secondary | ICD-10-CM | POA: Diagnosis not present

## 2019-12-03 DIAGNOSIS — R0600 Dyspnea, unspecified: Secondary | ICD-10-CM

## 2019-12-03 LAB — CBC WITH DIFFERENTIAL/PLATELET
Abs Immature Granulocytes: 0.04 10*3/uL (ref 0.00–0.07)
Basophils Absolute: 0 10*3/uL (ref 0.0–0.1)
Basophils Relative: 0 %
Eosinophils Absolute: 0.1 10*3/uL (ref 0.0–0.5)
Eosinophils Relative: 1 %
HCT: 38.3 % (ref 36.0–46.0)
Hemoglobin: 12.9 g/dL (ref 12.0–15.0)
Immature Granulocytes: 0 %
Lymphocytes Relative: 27 %
Lymphs Abs: 2.5 10*3/uL (ref 0.7–4.0)
MCH: 30.1 pg (ref 26.0–34.0)
MCHC: 33.7 g/dL (ref 30.0–36.0)
MCV: 89.5 fL (ref 80.0–100.0)
Monocytes Absolute: 0.7 10*3/uL (ref 0.1–1.0)
Monocytes Relative: 8 %
Neutro Abs: 5.8 10*3/uL (ref 1.7–7.7)
Neutrophils Relative %: 64 %
Platelets: 272 10*3/uL (ref 150–400)
RBC: 4.28 MIL/uL (ref 3.87–5.11)
RDW: 13 % (ref 11.5–15.5)
WBC: 9.2 10*3/uL (ref 4.0–10.5)
nRBC: 0 % (ref 0.0–0.2)

## 2019-12-03 LAB — PROTIME-INR
INR: 1.1 (ref 0.8–1.2)
Prothrombin Time: 13.3 seconds (ref 11.4–15.2)

## 2019-12-03 LAB — HEPARIN LEVEL (UNFRACTIONATED)
Heparin Unfractionated: 0.23 IU/mL — ABNORMAL LOW (ref 0.30–0.70)
Heparin Unfractionated: 0.35 IU/mL (ref 0.30–0.70)
Heparin Unfractionated: 0.24 IU/mL — ABNORMAL LOW (ref 0.30–0.70)

## 2019-12-03 LAB — ECHOCARDIOGRAM COMPLETE
Area-P 1/2: 4.46 cm2
Height: 67 in
S' Lateral: 2.51 cm
Weight: 3037.06 oz

## 2019-12-03 LAB — HIV ANTIBODY (ROUTINE TESTING W REFLEX): HIV Screen 4th Generation wRfx: NONREACTIVE

## 2019-12-03 MED ORDER — HYDROXYZINE HCL 10 MG PO TABS
10.0000 mg | ORAL_TABLET | Freq: Once | ORAL | Status: AC
  Administered 2019-12-03: 10 mg via ORAL
  Filled 2019-12-03: qty 1

## 2019-12-03 MED ORDER — MELATONIN 3 MG PO TABS
3.0000 mg | ORAL_TABLET | Freq: Every day | ORAL | Status: DC
  Administered 2019-12-03: 3 mg via ORAL
  Filled 2019-12-03: qty 1

## 2019-12-03 MED ORDER — HEPARIN BOLUS VIA INFUSION
1200.0000 [IU] | Freq: Once | INTRAVENOUS | Status: AC
  Administered 2019-12-03: 1200 [IU] via INTRAVENOUS
  Filled 2019-12-03: qty 1200

## 2019-12-03 MED ORDER — RIVAROXABAN 15 MG PO TABS
15.0000 mg | ORAL_TABLET | Freq: Two times a day (BID) | ORAL | Status: DC
  Administered 2019-12-04: 15 mg via ORAL
  Filled 2019-12-03: qty 1

## 2019-12-03 MED ORDER — HEPARIN BOLUS VIA INFUSION
2000.0000 [IU] | Freq: Once | INTRAVENOUS | Status: AC
  Administered 2019-12-03: 2000 [IU] via INTRAVENOUS
  Filled 2019-12-03: qty 2000

## 2019-12-03 MED ORDER — RIVAROXABAN 20 MG PO TABS: 20.0000 mg | ORAL_TABLET | Freq: Every day | ORAL | Status: DC

## 2019-12-03 NOTE — Progress Notes (Signed)
ANTICOAGULATION CONSULT NOTE   Pharmacy Consult for Heparin Indication: pulmonary embolus  Allergies  Allergen Reactions  . Penicillins     Patient doesn't know her reaction, states that she has been told she is allergic since she was young    Patient Measurements: Height: 5\' 7"  (170.2 cm) Weight: 86.1 kg (189 lb 13.1 oz) IBW/kg (Calculated) : 61.6 Heparin Dosing Weight: 80 kg  Vital Signs: Temp: 98.4 F (36.9 C) (11/23 0748) Temp Source: Oral (11/23 0748) BP: 112/70 (11/23 0748) Pulse Rate: 83 (11/23 0748)  Labs: Recent Labs    12/02/19 1346 12/03/19 0130 12/03/19 0953  HGB 13.6 12.9  --   HCT 41.6 38.3  --   PLT 298 272  --   LABPROT  --  13.3  --   INR  --  1.1  --   HEPARINUNFRC  --  0.23* 0.35  CREATININE 0.47  --   --     Estimated Creatinine Clearance: 114.8 mL/min (by C-G formula based on SCr of 0.47 mg/dL).   Medical History: Past Medical History:  Diagnosis Date  . Scoliosis    Assessment:  29 yr old female to begin IV heparin for pulmonary embolus per CTA.  Duplex pending.  Noted on OCPs and mother had PE during pregnancy.  11/23  HL is therapeutic at 0.35  No issues per RN  Goal of Therapy:  Heparin level 0.3-0.7 units/ml Monitor platelets by anticoagulation protocol: Yes   Plan:  Continue heparin infusion at 1500 units/hr Heparin level ~6 hours  And  Daily heparin level CBC daily  while on heparin.  12/23, BS Elder Cyphers, BCPS Clinical Pharmacist Pager [phone]

## 2019-12-03 NOTE — Progress Notes (Signed)
PROGRESS NOTE    Shirley SYDNEY  Peters:[id number] DOB: 11/15/1990 DOA: 12/02/2019 PCP: Elfredia Nevins, MD   Chief Complaint  Patient presents with  . Shortness of Breath  Brief Narrative: 29 year old female with history of scoliosis who is in PT for that presents to the ED with dyspnea, onset about 3 months ago and had chest pain on November 6 which is intense severity squeezing with radiation to her neck, managed with Pepto-Bismol but 11/20 1 AM she felt palpitation worsening dyspnea, worse with exertion was seen by PCP 11/22 and referred to the ED.  Work-up in the ED showed vitals is stable with mild tachypnea tachycardia stable WBC count hemoglobin, chest x-ray no active disease CT showed right pulmonary artery embolism without evidence of saddle or heart strain.  Patient was placed on IV heparin and was admitted for further management  Subjective:  Overnight Remains on room air, hemodynamically stable She reports she was not as short of breath on ambulation today.No chest pain at rest.  Assessment & Plan:  Marked severity pulmonary embolism within the right pulmonary artery, no evidence of saddle embolus or heart strain by CT, patient on estrogen/uses tobacco likely provoked by them. We will  Keep on IV heparin follow-up echocardiogram, duplex of lower extremities, monitor on telemetry.  Discussed about transition to DOAC discusesd risks/benefits indications alternatives will plan for transition to Xarelto tomorrow.  She will need to follow-up with hematology as outpatient and will need to provide with referral.  Discussed about not resuming OCPs.  Scoliosis continue her muscle relaxant, continue outpatient PT session.  Tobacco use disorder:She has been advised on cessation  Overweight with BMI 29.7:Will benefit with weight loss.  Nutrition: Diet Order            Diet regular Room service appropriate? Yes; Fluid consistency: Thin  Diet effective now                 Body mass  index is 29.73 kg/m.    DVT prophylaxis: SCDs Start: 12/02/19 2011 Code Status:   Code Status: Full Code  Family Communication: plan of care discussed with patient at bedside.  Status is: Inpatient  Remains inpatient appropriate because:Inpatient level of care appropriate due to severity of illness and For ongoing management of acute pulmonary embolism with IV heparin and further work-up   Dispo: The patient is from: Home              Anticipated d/c is to: Home              Anticipated d/c date is: 1 day              Patient currently is not medically stable to d/c.  Consultants:see note  Procedures:see note  Culture/Microbiology No results found for: SDES, SPECREQUEST, CULT, REPTSTATUS  Other culture-see note  Medications: Scheduled Meds: . multivitamin with minerals  1 tablet Oral Daily   Continuous Infusions: . heparin 1,500 Units/hr (12/03/19 0258)    Antimicrobials: Anti-infectives (From admission, onward)   None     Objective: Vitals: Today's Vitals   12/02/19 2355 12/03/19 0015 12/03/19 0044 12/03/19 0414  BP: 130/86 116/83  111/74  Pulse:  85  86  Resp:  18  18  Temp:  98.5 F (36.9 C)  98.6 F (37 C)  TempSrc:  Oral  Oral  SpO2:  98%  96%  Weight:      Height:      PainSc:   0-No pain  Intake/Output Summary (Last 24 hours) at 12/03/2019 0746 Last data filed at 12/03/2019 0308 Gross per 24 hour  Intake 127.83 ml  Output --  Net 127.83 ml   Filed Weights   12/02/19 1322 12/02/19 1817 12/02/19 2010  Weight: 83.9 kg 85.7 kg 86.1 kg   Weight change:   Intake/Output from previous day: 11/22 0701 - 11/23 0700 In: 127.8 [I.V.:127.8] Out: -  Intake/Output this shift: No intake/output data recorded.  Examination: General exam: AAO ,NAD, weak appearing. HEENT:Oral mucosa moist, Ear/Nose WNL grossly,dentition normal. Respiratory system: bilaterally clear,no wheezing or crackles,no use of accessory muscle, non tender. Cardiovascular  system: S1 & S2 +, regular, No JVD. Gastrointestinal system: Abdomen soft, NT,ND, BS+. Nervous System:Alert, awake, moving extremities and grossly nonfocal Extremities: No edema, distal peripheral pulses palpable.  Skin: No rashes,no icterus. MSK: Normal muscle bulk,tone, power  Data Reviewed: I have personally reviewed following labs and imaging studies CBC: Recent Labs  Lab 12/02/19 1346 12/03/19 0130  WBC 9.7 9.2  NEUTROABS  --  5.8  HGB 13.6 12.9  HCT 41.6 38.3  MCV 89.5 89.5  PLT 298 272   Basic Metabolic Panel: Recent Labs  Lab 12/02/19 1346  NA 137  K 3.7  CL 103  CO2 25  GLUCOSE 90  BUN 9  CREATININE 0.47  CALCIUM 8.7*   GFR: Estimated Creatinine Clearance: 114.8 mL/min (by C-G formula based on SCr of 0.47 mg/dL). Liver Function Tests: Recent Labs  Lab 12/02/19 1346  AST 15  ALT 17  ALKPHOS 58  BILITOT 0.2*  PROT 7.3  ALBUMIN 3.9   No results for input(s): LIPASE, AMYLASE in the last 168 hours. No results for input(s): AMMONIA in the last 168 hours. Coagulation Profile: Recent Labs  Lab 12/03/19 0130  INR 1.1   Cardiac Enzymes: No results for input(s): CKTOTAL, CKMB, CKMBINDEX, TROPONINI in the last 168 hours. BNP (last 3 results) No results for input(s): PROBNP in the last 8760 hours. HbA1C: No results for input(s): HGBA1C in the last 72 hours. CBG: No results for input(s): GLUCAP in the last 168 hours. Lipid Profile: No results for input(s): CHOL, HDL, LDLCALC, TRIG, CHOLHDL, LDLDIRECT in the last 72 hours. Thyroid Function Tests: No results for input(s): TSH, T4TOTAL, FREET4, T3FREE, THYROIDAB in the last 72 hours. Anemia Panel: No results for input(s): VITAMINB12, FOLATE, FERRITIN, TIBC, IRON, RETICCTPCT in the last 72 hours. Sepsis Labs: No results for input(s): PROCALCITON, LATICACIDVEN in the last 168 hours.  Recent Results (from the past 240 hour(s))  Resp Panel by RT-PCR (Flu A&B, Covid) Nasopharyngeal Swab     Status: None    Collection Time: 12/02/19  2:31 PM   Specimen: Nasopharyngeal Swab; Nasopharyngeal(NP) swabs in vial transport medium  Result Value Ref Range Status   SARS Coronavirus 2 by RT PCR NEGATIVE NEGATIVE Final    Comment: (NOTE) SARS-CoV-2 target nucleic acids are NOT DETECTED.  The SARS-CoV-2 RNA is generally detectable in upper respiratory specimens during the acute phase of infection. The lowest concentration of SARS-CoV-2 viral copies this assay can detect is 138 copies/mL. A negative result does not preclude SARS-Cov-2 infection and should not be used as the sole basis for treatment or other patient management decisions. A negative result may occur with  improper specimen collection/handling, submission of specimen other than nasopharyngeal swab, presence of viral mutation(s) within the areas targeted by this assay, and inadequate number of viral copies(<138 copies/mL). A negative result must be combined with clinical observations, patient history, and epidemiological information.  The expected result is Negative.  Fact Sheet for Patients:  BloggerCourse.comhttps://www.fda.gov/media/152166/download  Fact Sheet for Healthcare Providers:  SeriousBroker.ithttps://www.fda.gov/media/152162/download  This test is no t yet approved or cleared by the [place] FDA and  has been authorized for detection and/or diagnosis of SARS-CoV-2 by FDA under an Emergency Use Authorization (EUA). This EUA will remain  in effect (meaning this test can be used) for the duration of the COVID-19 declaration under Section 564(b)(1) of the Act, 21 U.S.C.section 360bbb-3(b)(1), unless the authorization is terminated  or revoked sooner.       Influenza A by PCR NEGATIVE NEGATIVE Final   Influenza B by PCR NEGATIVE NEGATIVE Final    Comment: (NOTE) The Xpert Xpress SARS-CoV-2/FLU/RSV plus assay is intended as an aid in the diagnosis of influenza from Nasopharyngeal swab specimens and should not be used as a sole basis for treatment.  Nasal washings and aspirates are unacceptable for Xpert Xpress SARS-CoV-2/FLU/RSV testing.  Fact Sheet for Patients: BloggerCourse.comhttps://www.fda.gov/media/152166/download  Fact Sheet for Healthcare Providers: SeriousBroker.ithttps://www.fda.gov/media/152162/download  This test is not yet approved or cleared by the [place] FDA and has been authorized for detection and/or diagnosis of SARS-CoV-2 by FDA under an Emergency Use Authorization (EUA). This EUA will remain in effect (meaning this test can be used) for the duration of the COVID-19 declaration under Section 564(b)(1) of the Act, 21 U.S.C. section 360bbb-3(b)(1), unless the authorization is terminated or revoked.  Performed at [facility], [address] [place], [place] [address]      Radiology Studies: CT Angio Chest PE W and/or Wo Contrast  Addendum Date: 12/02/2019   ADDENDUM REPORT: 12/02/2019 17:56 ADDENDUM: Results were discussed with Dr. Royston SinnerWokowski at 5:22 p.m. [place] on December 02, 2019. Electronically Signed   By: Aram Candelahaddeus  Houston M.D.   On: 12/02/2019 17:56   Result Date: 12/02/2019 CLINICAL DATA:  Shortness of breath. EXAM: CT ANGIOGRAPHY CHEST WITH CONTRAST TECHNIQUE: Multidetector CT imaging of the chest was performed using the standard protocol during bolus administration of intravenous contrast. Multiplanar CT image reconstructions and MIPs were obtained to evaluate the vascular anatomy. CONTRAST:  100mL OMNIPAQUE IOHEXOL 350 MG/ML SOLN COMPARISON:  None. FINDINGS: Cardiovascular: A marked amount of intraluminal low attenuation is seen within the right pulmonary artery. Extension to involve multiple upper lobe, middle lobe and lower lobe branches is seen. There is no evidence of saddle embolus. Normal heart size without evidence of heart strain. No pericardial effusion. Mediastinum/Nodes: No enlarged mediastinal, hilar, or axillary lymph nodes. Thyroid gland, trachea, and esophagus demonstrate no significant findings. Lungs/Pleura:  Lungs are clear. No pleural effusion or pneumothorax. Upper Abdomen: No acute abnormality. Musculoskeletal: There is moderate severity dextroscoliosis of the thoracic spine. Metallic density pedicle screws are seen throughout the thoracic spine. Review of the MIP images confirms the above findings. IMPRESSION: Marked severity pulmonary embolism within the right pulmonary artery without evidence of saddle embolus or heart strain. Electronically Signed: By: Aram Candelahaddeus  Houston M.D. On: 12/02/2019 17:15   DG Chest Port 1 View  Result Date: 12/02/2019 CLINICAL DATA:  Dyspnea with exertion for the past 2 weeks EXAM: PORTABLE CHEST 1 VIEW COMPARISON:  01/07/2010 FINDINGS: Mild lingular atelectasis. No focal consolidation. No pleural effusion or pneumothorax. Stable cardiomegaly. No acute osseous abnormality. S-shaped scoliosis of the thoracolumbar spine with posterior spinal fixation hardware present. IMPRESSION: No active disease. Electronically Signed   By: Elige KoHetal  Patel   On: 12/02/2019 14:19     LOS: 1 day   Lanae Boastamesh Jerrit Horen, MD [facility]  12/03/2019,  7:46 AM

## 2019-12-03 NOTE — Progress Notes (Signed)
*  PRELIMINARY RESULTS* Echocardiogram 2D Echocardiogram has been performed.  Shirley Peters 12/03/2019, 3:09 PM

## 2019-12-03 NOTE — Progress Notes (Signed)
ANTICOAGULATION CONSULT NOTE   Pharmacy Consult for Heparin Indication: pulmonary embolus  Allergies  Allergen Reactions  . Penicillins     Patient doesn't know her reaction, states that she has been told she is allergic since she was young    Patient Measurements: Height: 5\' 7"  (170.2 cm) Weight: 86.1 kg (189 lb 13.1 oz) IBW/kg (Calculated) : 61.6 Heparin Dosing Weight: 80 kg  Vital Signs: Temp: 98.5 F (36.9 C) (11/23 0015) Temp Source: Oral (11/23 0015) BP: 116/83 (11/23 0015) Pulse Rate: 85 (11/23 0015)  Labs: Recent Labs    12/02/19 1346 12/03/19 0130  HGB 13.6 12.9  HCT 41.6 38.3  PLT 298 272  LABPROT  --  13.3  INR  --  1.1  HEPARINUNFRC  --  0.23*  CREATININE 0.47  --     Estimated Creatinine Clearance: 114.8 mL/min (by C-G formula based on SCr of 0.47 mg/dL).   Medical History: Past Medical History:  Diagnosis Date  . Scoliosis    Assessment:  29 yr old female to begin IV heparin for pulmonary embolus per CTA.  Duplex pending.  Noted on OCPs and mother had PE during pregnancy.  11/23 AM update:  Initial heparin level is sub-therapeutic  No issues per RN  Goal of Therapy:  Heparin level 0.3-0.7 units/ml Monitor platelets by anticoagulation protocol: Yes   Plan:  -Heparin 2000 units re-bolus -Inc heparin to 1500 units/hr -1000 heparin level  12/23, PharmD, BCPS Clinical Pharmacist Phone: [phone]

## 2019-12-03 NOTE — Progress Notes (Addendum)
ANTICOAGULATION CONSULT NOTE   Pharmacy Consult for Heparin (transition to Xarelto 11/24) Indication: pulmonary embolus  Allergies  Allergen Reactions  . Penicillins     Patient doesn't know her reaction, states that she has been told she is allergic since she was young    Patient Measurements: Height: 5\' 7"  (170.2 cm) Weight: 86.1 kg (189 lb 13.1 oz) IBW/kg (Calculated) : 61.6 Heparin Dosing Weight: 80 kg  Vital Signs: Temp: 98.2 F (36.8 C) (11/23 1450) Temp Source: Oral (11/23 1450) BP: 114/70 (11/23 1450) Pulse Rate: 83 (11/23 1450)  Labs: Recent Labs    12/02/19 1346 12/03/19 0130 12/03/19 0953 12/03/19 1917  HGB 13.6 12.9  --   --   HCT 41.6 38.3  --   --   PLT 298 272  --   --   LABPROT  --  13.3  --   --   INR  --  1.1  --   --   HEPARINUNFRC  --  0.23* 0.35 0.24*  CREATININE 0.47  --   --   --     Estimated Creatinine Clearance: 114.8 mL/min (by C-G formula based on SCr of 0.47 mg/dL).   Medical History: Past Medical History:  Diagnosis Date  . Scoliosis    Assessment: 29 yr old female on IV heparin for pulmonary embolus without heart strain.  Duplex is negative for DVT.  Noted that pt is on OCPs and mother had PE during pregnancy.  Confirmatory heparin level ~9.5 hrs after initial therapeutic heparin level on heparin infusion at 1500 units/hr was 0.24 units/ml, which is below the goal range for this pt. CBC WNL. Per RN, no issues with IV or bleeding observed.  Goal of Therapy:  Heparin level 0.3-0.7 units/ml Monitor platelets by anticoagulation protocol: Yes   Plan:  Give heparin 1200 units IV bolus X 1 and increase heparin infusion to 1650 units/hr Check heparin level in 6 hrs D/C heparin at 0800 on 11/24 at 0800 and start Xarelto 15 mg po BID x 3 weeks, followed by Xarelto 20 mg daily in the evening; take with food the same time(s) each day Educate pt on Xarelto Monitor daily heparin level while on heparin Monitor CBC Monitor for  signs/symptoms of bleeding  12/24, BS Elder Cyphers, BCPS Clinical Pharmacist Pager [phone]

## 2019-12-03 NOTE — Plan of Care (Signed)
  Problem: Education: Goal: Knowledge of General Education information will improve Description: Including pain rating scale, medication(s)/side effects and non-pharmacologic comfort measures Outcome: Progressing   Problem: Health Behavior/Discharge Planning: Goal: Ability to manage health-related needs will improve Outcome: Progressing   Problem: Clinical Measurements: Goal: Will remain free from infection Outcome: Progressing   Problem: Activity: Goal: Risk for activity intolerance will decrease Outcome: Progressing   Problem: Nutrition: Goal: Adequate nutrition will be maintained Outcome: Progressing   Problem: Coping: Goal: Level of anxiety will decrease Outcome: Progressing   Problem: Safety: Goal: Ability to remain free from injury will improve Outcome: Progressing   Problem: Skin Integrity: Goal: Risk for impaired skin integrity will decrease Outcome: Progressing   

## 2019-12-03 NOTE — Progress Notes (Signed)
ANTICOAGULATION CONSULT NOTE   Pharmacy Consult for Heparin transition to Xarelto 11/24 Indication: pulmonary embolus  Allergies  Allergen Reactions  . Penicillins     Patient doesn't know her reaction, states that she has been told she is allergic since she was young    Patient Measurements: Height: 5\' 7"  (170.2 cm) Weight: 86.1 kg (189 lb 13.1 oz) IBW/kg (Calculated) : 61.6 Heparin Dosing Weight: 80 kg  Vital Signs: Temp: 98.4 F (36.9 C) (11/23 0748) Temp Source: Oral (11/23 0748) BP: 112/70 (11/23 0748) Pulse Rate: 83 (11/23 0748)  Labs: Recent Labs    12/02/19 1346 12/03/19 0130 12/03/19 0953  HGB 13.6 12.9  --   HCT 41.6 38.3  --   PLT 298 272  --   LABPROT  --  13.3  --   INR  --  1.1  --   HEPARINUNFRC  --  0.23* 0.35  CREATININE 0.47  --   --     Estimated Creatinine Clearance: 114.8 mL/min (by C-G formula based on SCr of 0.47 mg/dL).   Medical History: Past Medical History:  Diagnosis Date  . Scoliosis    Assessment:  29 yr old female to begin IV heparin for pulmonary embolus per CTA.  Duplex pending.  Noted on OCPs and mother had PE during pregnancy.  11/23  HL is therapeutic at 0.35  No issues per RN  Goal of Therapy:  Heparin level 0.3-0.7 units/ml Monitor platelets by anticoagulation protocol: Yes   Plan:  D/C heparin 11/24 at 0800 and start xarelto 15mg  po bid x 3 weeks, then 20mg  daily in the evening with food Educate on xarelto Continue heparin infusion at 1500 units/hr until 11/24 0800 Heparin level ~6 hours  CBC daily  while on heparin.  , BS , BCPS Clinical Pharmacist Pager [phone]

## 2019-12-04 DIAGNOSIS — R0600 Dyspnea, unspecified: Secondary | ICD-10-CM

## 2019-12-04 DIAGNOSIS — I2699 Other pulmonary embolism without acute cor pulmonale: Secondary | ICD-10-CM | POA: Diagnosis not present

## 2019-12-04 DIAGNOSIS — I2694 Multiple subsegmental pulmonary emboli without acute cor pulmonale: Secondary | ICD-10-CM

## 2019-12-04 LAB — CBC
HCT: 39.5 % (ref 36.0–46.0)
Hemoglobin: 12.8 g/dL (ref 12.0–15.0)
MCH: 29.4 pg (ref 26.0–34.0)
MCHC: 32.4 g/dL (ref 30.0–36.0)
MCV: 90.6 fL (ref 80.0–100.0)
Platelets: 282 K/uL (ref 150–400)
RBC: 4.36 MIL/uL (ref 3.87–5.11)
RDW: 12.6 % (ref 11.5–15.5)
WBC: 12.4 K/uL — ABNORMAL HIGH (ref 4.0–10.5)
nRBC: 0 % (ref 0.0–0.2)

## 2019-12-04 LAB — BASIC METABOLIC PANEL WITH GFR
Anion gap: 10 (ref 5–15)
BUN: 8 mg/dL (ref 6–20)
CO2: 25 mmol/L (ref 22–32)
Calcium: 8.4 mg/dL — ABNORMAL LOW (ref 8.9–10.3)
Chloride: 101 mmol/L (ref 98–111)
Creatinine, Ser: 0.54 mg/dL (ref 0.44–1.00)
GFR, Estimated: >60 mL/min
Glucose, Bld: 117 mg/dL — ABNORMAL HIGH (ref 70–99)
Potassium: 4.1 mmol/L (ref 3.5–5.1)
Sodium: 136 mmol/L (ref 135–145)

## 2019-12-04 LAB — HEPARIN LEVEL (UNFRACTIONATED): Heparin Unfractionated: 0.21 IU/mL — ABNORMAL LOW (ref 0.30–0.70)

## 2019-12-04 MED ORDER — RIVAROXABAN (XARELTO) VTE STARTER PACK (15 & 20 MG): ORAL_TABLET | ORAL | 0 refills | Status: DC

## 2019-12-04 NOTE — Discharge Summary (Signed)
Physician Discharge Summary  Shirley Peters UDJ:[id number] DOB: [birth year] DOA: 12/02/2019  PCP: Elfredia Nevins, MD  Admit date: 12/02/2019 Discharge date: 12/04/2019  Time spent: 50  minutes  Recommendations for Outpatient Follow-up:  1. Follow-up PCP in 2 weeks  2. Stable Follow-up hematology in 4 weeks   Discharge Diagnoses:  Active Problems:   Pulmonary embolus [facility])   Discharge Condition: Stable  Diet recommendation: Heart healthy diet  Filed Weights   12/02/19 1322 12/02/19 1817 12/02/19 2010  Weight: 83.9 kg 85.7 kg 86.1 kg    History of present illness:  29 year old female with history of scoliosis who is in PT for that presents to the ED with dyspnea, onset about 3 months ago and had chest pain on November 6 which is intense severity squeezing with radiation to her neck, managed with Pepto-Bismol but 11/20 1 AM she felt palpitation worsening dyspnea, worse with exertion was seen by PCP 11/22 and referred to the ED.  Work-up in the ED showed vitals is stable with mild tachypnea tachycardia stable WBC count hemoglobin, chest x-ray no active disease CT showed right pulmonary artery embolism without evidence of saddle or heart strain.  Patient was placed on IV heparin and was admitted for further management  Hospital Course:  Pulmonary embolism within the right pulmonary artery-no evidence of heart strain by CT, echo also shows no heart strain. Patient has been on birth control pills estrogen, also has been using tobacco.  She was started on IV heparin.  Currently switched to Xarelto.  She will follow up with hematology in 4 weeks as outpatient.  Scoliosis-continue muscle relaxant as needed.  Tobacco use disorder-consult to quit smoking.  Overweight with BMI 29.7-she will benefit from weight loss.  Procedures:    Consultations:    Discharge Exam: Vitals:   12/03/19 2242 12/04/19 0604  BP: 126/86 107/74  Pulse: 96 86  Resp: 15 17  Temp: 99.1 F (37.3 C) 98.4  F (36.9 C)  SpO2: 100% 98%    General: Appears in no acute distress Cardiovascular: S1-S2, regular  Respiratory: clear to auscultation bilaterally*  Discharge Instructions   Discharge Instructions    Diet - low sodium heart healthy   Complete by: As directed    Discharge instructions   Complete by: As directed    Do not take birth control pills. Stop smoking cigarettes   Increase activity slowly   Complete by: As directed      Allergies as of 12/04/2019      Reactions   Penicillins    Patient doesn't know her reaction, states that she has been told she is allergic since she was young      Medication List    STOP taking these medications   celecoxib 200 MG capsule Commonly known as: CeleBREX   citalopram 20 MG tablet Commonly known as: CELEXA   diclofenac 1.3 % Ptch Commonly known as: Flector   ibuprofen 200 MG tablet Commonly known as: ADVIL   norethindrone-ethinyl estradiol 1-20 MG-MCG tablet Commonly known as: Junel FE 1/20   ZITHROMAX Z-PAK PO     TAKE these medications   Biofreeze 4 % Gel Generic drug: Menthol (Topical Analgesic) May roll on to area of discomfort every 4 hours as needed for pain.   metaxalone 800 MG tablet Commonly known as: Skelaxin Take 1 tablet (800 mg total) by mouth 3 (three) times daily.   Rivaroxaban Stater Pack (15 mg and 20 mg) Commonly known as: XARELTO STARTER PACK Follow package directions: Take  one 15mg  tablet by mouth twice a day. On day 22, switch to one 20mg  tablet once a day. Take with food.      Allergies  Allergen Reactions  . Penicillins     Patient doesn't know her reaction, states that she has been told she is allergic since she was young    Follow-up Information    , MD Follow up in 4 week(s).   Specialty: Hematology Contact information: [address] [place] [address] [address] [place] [phone]                The results of significant diagnostics from this hospitalization  (including imaging, microbiology, ancillary and laboratory) are listed below for reference.    Significant Diagnostic Studies: CT Angio Chest PE W and/or Wo Contrast  Addendum Date: 12/02/2019   ADDENDUM REPORT: 12/02/2019 17:56 ADDENDUM: Results were discussed with Dr. 12/04/2019 at 5:22 p.m. 12/04/2019 on December 02, 2019. Electronically Signed   By: [place] M.D.   On: 12/02/2019 17:56   Result Date: 12/02/2019 CLINICAL DATA:  Shortness of breath. EXAM: CT ANGIOGRAPHY CHEST WITH CONTRAST TECHNIQUE: Multidetector CT imaging of the chest was performed using the standard protocol during bolus administration of intravenous contrast. Multiplanar CT image reconstructions and MIPs were obtained to evaluate the vascular anatomy. CONTRAST:  12/04/2019 OMNIPAQUE IOHEXOL 350 MG/ML SOLN COMPARISON:  None. FINDINGS: Cardiovascular: A marked amount of intraluminal low attenuation is seen within the right pulmonary artery. Extension to involve multiple upper lobe, middle lobe and lower lobe branches is seen. There is no evidence of saddle embolus. Normal heart size without evidence of heart strain. No pericardial effusion. Mediastinum/Nodes: No enlarged mediastinal, hilar, or axillary lymph nodes. Thyroid gland, trachea, and esophagus demonstrate no significant findings. Lungs/Pleura: Lungs are clear. No pleural effusion or pneumothorax. Upper Abdomen: No acute abnormality. Musculoskeletal: There is moderate severity dextroscoliosis of the thoracic spine. Metallic density pedicle screws are seen throughout the thoracic spine. Review of the MIP images confirms the above findings. IMPRESSION: Marked severity pulmonary embolism within the right pulmonary artery without evidence of saddle embolus or heart strain. Electronically Signed: By: 12/04/2019 M.D. On: 12/02/2019 17:15   Aram Candela Venous Img Lower Bilateral (DVT)  Result Date: 12/03/2019 CLINICAL DATA:  PE.  Shortness of breath EXAM: BILATERAL LOWER EXTREMITY  VENOUS DOPPLER ULTRASOUND TECHNIQUE: Gray-scale sonography with compression, as well as color and duplex ultrasound, were performed to evaluate the deep venous system(s) from the level of the common femoral vein through the popliteal and proximal calf veins. COMPARISON:  None. FINDINGS: VENOUS Normal compressibility of the common femoral, superficial femoral, and popliteal veins, as well as the visualized calf veins. Visualized portions of profunda femoral vein and great saphenous vein unremarkable. No filling defects to suggest DVT on grayscale or color Doppler imaging. Doppler waveforms show normal direction of venous flow, normal respiratory plasticity and response to augmentation. OTHER None. Limitations: none IMPRESSION: Negative. Electronically Signed   By: [place] M.D.   On: 12/03/2019 12:11   DG Chest Port 1 View  Result Date: 12/02/2019 CLINICAL DATA:  Dyspnea with exertion for the past 2 weeks EXAM: PORTABLE CHEST 1 VIEW COMPARISON:  01/07/2010 FINDINGS: Mild lingular atelectasis. No focal consolidation. No pleural effusion or pneumothorax. Stable cardiomegaly. No acute osseous abnormality. S-shaped scoliosis of the thoracolumbar spine with posterior spinal fixation hardware present. IMPRESSION: No active disease. Electronically Signed   By: 12/04/2019   On: 12/02/2019 14:19   ECHOCARDIOGRAM COMPLETE  Result Date: 12/03/2019  ECHOCARDIOGRAM REPORT   Patient Name:   Shirley Peters Date of Exam: 12/03/2019 Medical Rec #:  [id number]    Height:       67.0 in Accession #:    [id number]   Weight:       189.8 lb Date of Birth:  03/25/1990    BSA:          1.978 m Patient Age:    29 years     BP:           111/74 mmHg Patient Gender: F            HR:           86 bpm. [facility] Procedure: 2D Echo Indications:    Pulmonary Embolus 415.19 / I26.99  History:        Patient has no prior history of Echocardiogram examinations.                 Risk Factors:Current Smoker. Pulmonary  Embolus.  Sonographer:    Jeryl ColumbiaJohanna Elliott RDCS (AE) Referring Phys: [id number] ASIA B ZIERLE-GHOSH IMPRESSIONS  1. Abnormal septal motion . Left ventricular ejection fraction, by estimation, is 60 to 65%. The left ventricle has normal function. The left ventricle has no regional wall motion abnormalities. Left ventricular diastolic parameters were normal.  2. Right ventricular systolic function is normal. The right ventricular size is normal.  3. The mitral valve is normal in structure. No evidence of mitral valve regurgitation. No evidence of mitral stenosis.  4. The aortic valve is normal in structure. Aortic valve regurgitation is not visualized. No aortic stenosis is present.  5. The inferior vena cava is normal in size with greater than 50% respiratory variability, suggesting right atrial pressure of 3 mmHg. FINDINGS  Left Ventricle: Abnormal septal motion. Left ventricular ejection fraction, by estimation, is 60 to 65%. The left ventricle has normal function. The left ventricle has no regional wall motion abnormalities. The left ventricular internal cavity size was normal in size. There is no left ventricular hypertrophy. Left ventricular diastolic parameters were normal. Right Ventricle: The right ventricular size is normal. No increase in right ventricular wall thickness. Right ventricular systolic function is normal. Left Atrium: Left atrial size was normal in size. Right Atrium: Right atrial size was normal in size. Pericardium: There is no evidence of pericardial effusion. Mitral Valve: The mitral valve is normal in structure. No evidence of mitral valve regurgitation. No evidence of mitral valve stenosis. Tricuspid Valve: The tricuspid valve is normal in structure. Tricuspid valve regurgitation is trivial. No evidence of tricuspid stenosis. Aortic Valve: The aortic valve is normal in structure. Aortic valve regurgitation is not visualized. No aortic stenosis is present. Pulmonic Valve: The pulmonic valve  was normal in structure. Pulmonic valve regurgitation is not visualized. No evidence of pulmonic stenosis. Aorta: The aortic root is normal in size and structure. Venous: The inferior vena cava is normal in size with greater than 50% respiratory variability, suggesting right atrial pressure of 3 mmHg. IAS/Shunts: No atrial level shunt detected by color flow Doppler.  LEFT VENTRICLE PLAX 2D LVIDd:         4.29 cm  Diastology LVIDs:         2.51 cm  LV e' medial:    10.90 cm/s LV PW:         0.94 cm  LV E/e' medial:  6.4 LV IVS:        0.79 cm  LV e'  lateral:   12.10 cm/s LVOT diam:     1.80 cm  LV E/e' lateral: 5.7 LVOT Area:     2.54 cm  RIGHT VENTRICLE RV S prime:     11.80 cm/s TAPSE (M-mode): 2.6 cm LEFT ATRIUM           Index       RIGHT ATRIUM           Index LA diam:      3.10 cm 1.57 cm/m  RA Area:     13.20 cm LA Vol (A2C): 25.6 ml 12.94 ml/m RA Volume:   28.60 ml  14.46 ml/m LA Vol (A4C): 21.3 ml 10.77 ml/m   AORTA Ao Root diam: 2.60 cm MITRAL VALVE MV Area (PHT): 4.46 cm    SHUNTS MV Decel Time: 170 msec    Systemic Diam: 1.80 cm MV E velocity: 69.50 cm/s MV A velocity: 59.60 cm/s MV E/A ratio:  1.17 Charlton Haws MD Electronically signed by Charlton Haws MD Signature Date/Time: 12/03/2019/4:06:08 PM    Final     Microbiology: Recent Results (from the past 240 hour(s))  Resp Panel by RT-PCR (Flu A&B, Covid) Nasopharyngeal Swab     Status: None   Collection Time: 12/02/19  2:31 PM   Specimen: Nasopharyngeal Swab; Nasopharyngeal(NP) swabs in vial transport medium  Result Value Ref Range Status   SARS Coronavirus 2 by RT PCR NEGATIVE NEGATIVE Final    Comment: (NOTE) SARS-CoV-2 target nucleic acids are NOT DETECTED.  The SARS-CoV-2 RNA is generally detectable in upper respiratory specimens during the acute phase of infection. The lowest concentration of SARS-CoV-2 viral copies this assay can detect is 138 copies/mL. A negative result does not preclude SARS-Cov-2 infection and should not  be used as the sole basis for treatment or other patient management decisions. A negative result may occur with  improper specimen collection/handling, submission of specimen other than nasopharyngeal swab, presence of viral mutation(s) within the areas targeted by this assay, and inadequate number of viral copies(<138 copies/mL). A negative result must be combined with clinical observations, patient history, and epidemiological information. The expected result is Negative.  Fact Sheet for Patients:  BloggerCourse.com  Fact Sheet for Healthcare Providers:  SeriousBroker.it  This test is no t yet approved or cleared by the [place] FDA and  has been authorized for detection and/or diagnosis of SARS-CoV-2 by FDA under an Emergency Use Authorization (EUA). This EUA will remain  in effect (meaning this test can be used) for the duration of the COVID-19 declaration under Section 564(b)(1) of the Act, 21 U.S.C.section 360bbb-3(b)(1), unless the authorization is terminated  or revoked sooner.       Influenza A by PCR NEGATIVE NEGATIVE Final   Influenza B by PCR NEGATIVE NEGATIVE Final    Comment: (NOTE) The Xpert Xpress SARS-CoV-2/FLU/RSV plus assay is intended as an aid in the diagnosis of influenza from Nasopharyngeal swab specimens and should not be used as a sole basis for treatment. Nasal washings and aspirates are unacceptable for Xpert Xpress SARS-CoV-2/FLU/RSV testing.  Fact Sheet for Patients: BloggerCourse.com  Fact Sheet for Healthcare Providers: SeriousBroker.it  This test is not yet approved or cleared by the [place] FDA and has been authorized for detection and/or diagnosis of SARS-CoV-2 by FDA under an Emergency Use Authorization (EUA). This EUA will remain in effect (meaning this test can be used) for the duration of the COVID-19 declaration under Section  564(b)(1) of the Act, 21 U.S.C. section 360bbb-3(b)(1), unless the authorization  is terminated or revoked.  Performed at [facility], [address], [place], [place] [address]      Labs: Basic Metabolic Panel: Recent Labs  Lab 12/02/19 1346 12/04/19 0401  NA 137 136  K 3.7 4.1  CL 103 101  CO2 25 25  GLUCOSE 90 117*  BUN 9 8  CREATININE 0.47 0.54  CALCIUM 8.7* 8.4*   Liver Function Tests: Recent Labs  Lab 12/02/19 1346  AST 15  ALT 17  ALKPHOS 58  BILITOT 0.2*  PROT 7.3  ALBUMIN 3.9   CBC: Recent Labs  Lab 12/02/19 1346 12/03/19 0130 12/04/19 0401  WBC 9.7 9.2 12.4*  NEUTROABS  --  5.8  --   HGB 13.6 12.9 12.8  HCT 41.6 38.3 39.5  MCV 89.5 89.5 90.6  PLT 298 272 282       Signed:  Meredeth Ide MD.  [facility] 12/04/2019, 12:12 PM

## 2019-12-06 MED FILL — METAXALONE 800 MG TABS: 800 | 10 days supply | Qty: 30 | Fill #1

## 2019-12-09 ENCOUNTER — Encounter: Payer: Self-pay | Admitting: *Deleted

## 2019-12-09 ENCOUNTER — Other Ambulatory Visit: Payer: Self-pay | Admitting: *Deleted

## 2019-12-09 NOTE — Patient Outreach (Signed)
[facility]) Care Management  12/09/2019  Shirley Peters [birth year] [id number]   Transition of care call  Referral received:12/03/19 Initial outreach:12/09/19 Insurance: [facility]   EMMI Red Alert  Day: 1 Date: 12/06/19  Reason : Know who to call about changes in condition? No, Scheduled follow up ? NO, Other questions/concerns, Yes.    Transition of care call/case closure   Referral received: 12/03/19  Initial outreach: 12/09/19  Insurance: [employer]MR    Subjective: Initial successful telephone call to patient's preferred number in order to complete transition of care assessment; 2 HIPAA identifiers verified. Explained purpose of call and completed transition of care assessment.  Shirley Peters  states that that she is starting to feel better. She states that she has an occasional cough, on occasion she  has had low grade fever,100.2  some shortness of breath with activity.   She reports still having some discomfort at right neck/rib area, no worsening since discharge. She has not smoked since discharge.  She discussed tolerating diet, denies bowel or bladder problems. She has support from significant other/family with recovery. She has follow up appointment with PCP on 12/1. Reinforced notifying sooner for worsening symptoms, she denies sign/symptoms of bleeding.  Shirley Peters reports attending outpatient physical therapy prior to admission and has pending appointments, encouraged discussed when appropriate to return to outpatient therapy with provider at visit.   Reviewed accessing the following [place] Benefits : She denies any ongoing health issues and says she does not need a referral to one of the [place] chronic disease management programs. Discussed [place] benefits for smoking cessation provided contact information .   Discussed [place] beneftis  She does not have the hospital indemnity Provided contact number to [facility] benefits for questions related to benefits.   She  uses a [facility] outpatient pharmacy at [employer]  EMMI Red Flag  Addressed EMMI red flag questions, Knows who to call about changes in conditions, she voiced understanding who to call, per PCP. Scheduled follow up , patient has contacted PCP office to schedule post discharge visit with provider, Other questions/concerns she denies having other questions/concerns at this time. Advised patient that she may received one additional automated call for follow up and depending on answer it may trigger follow up call from Care Coordinator she voiced understanding.    Objective:   Shirley Peters   was hospitalized at [facility] 11/22-11/24/21 for Pulmonary Embolus   . Comorbidities include: History of Scolosis, with outpatient physical therapy. She  was discharged to home on 12/04/19  without the need for home health services or durable medical equipment per the discharge summary.   Assessment:  Patient voices good understanding of all discharge instructions.  See transition of care flowsheet for assessment details.   Plan:  Reviewed hospital discharge diagnosis of Pulmonary Embolus   and discharge treatment plan using hospital discharge instructions, assessing medication adherence, reviewing problems requiring provider notification, and discussing the importance of follow up with , primary care provider and/or specialists as directed.  Reviewed [place] healthy lifestyle program information to receive discounted premium for  2022   Step 1: Get  your annual physical  Step 2: Complete your health assessment  Step 3:Identify your current health status and complete the corresponding action step between January 1, and September 11, 2019.    No ongoing care management needs identified so will close case to [facility] Care Management services and route successful outreach letter with [employer]  Care Management pamphlet and 24 Hour Nurse Line Magnet to [facility] Care Management clinical pool to be mailed to patient's home address.    Egbert Garibaldi, RN, BSN  [facility] Care Management,Care Management Coordinator  [phone]- Mobile [phone]- Toll Free Main Office

## 2019-12-11 ENCOUNTER — Ambulatory Visit (HOSPITAL_COMMUNITY): Payer: No Typology Code available for payment source | Admitting: Physical Therapy

## 2019-12-11 ENCOUNTER — Telehealth (HOSPITAL_COMMUNITY): Payer: Self-pay | Admitting: Physical Therapy

## 2019-12-11 MED FILL — buPROPion HCL 100 MG TABS: 100 | 30 days supply | Qty: 60 | Fill #0

## 2019-12-11 MED FILL — DOXYCYCLINE HYCLATE 100 MG: 100 | 10 days supply | Qty: 20 | Fill #0

## 2019-12-11 NOTE — Telephone Encounter (Signed)
pt called to cx all of her appts untill further notice since she just got out of the hospital for a blood clot in her lungs.

## 2019-12-12 ENCOUNTER — Other Ambulatory Visit (HOSPITAL_COMMUNITY): Payer: Self-pay | Admitting: Internal Medicine

## 2019-12-12 ENCOUNTER — Ambulatory Visit (HOSPITAL_COMMUNITY): Payer: No Typology Code available for payment source | Admitting: Physical Therapy

## 2019-12-12 DIAGNOSIS — R051 Acute cough: Secondary | ICD-10-CM

## 2019-12-18 ENCOUNTER — Ambulatory Visit (HOSPITAL_COMMUNITY): Payer: No Typology Code available for payment source | Admitting: Physical Therapy

## 2019-12-19 ENCOUNTER — Ambulatory Visit (HOSPITAL_COMMUNITY): Payer: No Typology Code available for payment source

## 2019-12-24 ENCOUNTER — Ambulatory Visit (HOSPITAL_COMMUNITY): Payer: No Typology Code available for payment source | Admitting: Physical Therapy

## 2019-12-26 ENCOUNTER — Other Ambulatory Visit: Payer: Self-pay

## 2019-12-26 ENCOUNTER — Ambulatory Visit
Admission: EM | Admit: 2019-12-26 | Discharge: 2019-12-26 | Disposition: A | Discharge status: Home / Self Care | Payer: No Typology Code available for payment source | Attending: Internal Medicine | Admitting: Internal Medicine

## 2019-12-26 ENCOUNTER — Ambulatory Visit (HOSPITAL_COMMUNITY): Payer: No Typology Code available for payment source | Admitting: Physical Therapy

## 2019-12-26 ENCOUNTER — Other Ambulatory Visit: Payer: Self-pay | Admitting: Internal Medicine

## 2019-12-26 ENCOUNTER — Ambulatory Visit (INDEPENDENT_AMBULATORY_CARE_PROVIDER_SITE_OTHER): Payer: No Typology Code available for payment source

## 2019-12-26 ENCOUNTER — Ambulatory Visit: Payer: Self-pay

## 2019-12-26 DIAGNOSIS — R0600 Dyspnea, unspecified: Secondary | ICD-10-CM

## 2019-12-26 DIAGNOSIS — R059 Cough, unspecified: Secondary | ICD-10-CM

## 2019-12-26 DIAGNOSIS — R0602 Shortness of breath: Secondary | ICD-10-CM | POA: Diagnosis not present

## 2019-12-26 DIAGNOSIS — I2699 Other pulmonary embolism without acute cor pulmonale: Secondary | ICD-10-CM | POA: Diagnosis not present

## 2019-12-26 DIAGNOSIS — R0609 Other forms of dyspnea: Secondary | ICD-10-CM

## 2019-12-26 MED ORDER — BENZONATATE 100 MG PO CAPS: 100.0000 mg | ORAL_CAPSULE | Freq: Three times a day (TID) | ORAL | 0 refills | Status: DC

## 2019-12-26 MED FILL — BENZONATATE 100 MG CAPS: 100 | 7 days supply | Qty: 21 | Fill #0

## 2019-12-26 NOTE — ED Triage Notes (Signed)
Pt presents with sob and cough. Pt has completed 10 days of doxycycline with no relief, has productive cough , wants chest x ray

## 2019-12-26 NOTE — Discharge Instructions (Signed)
Deep breathing exercises. Increase physical activity as tolerated. Tylenol as needed for pain Take medications as tolerated.

## 2019-12-30 ENCOUNTER — Other Ambulatory Visit (HOSPITAL_COMMUNITY): Payer: Self-pay | Admitting: Internal Medicine

## 2019-12-30 MED FILL — XARELTO 20 MG TABLET: 20 | 30 days supply | Qty: 30 | Fill #0

## 2019-12-31 ENCOUNTER — Encounter (HOSPITAL_COMMUNITY): Payer: No Typology Code available for payment source | Admitting: Physical Therapy

## 2019-12-31 NOTE — ED Provider Notes (Signed)
[facility]    CSN: [id number] Arrival date & time: 12/26/19  1151      History   Chief Complaint Chief Complaint  Patient presents with   Shortness of Breath    HPI Shirley Peters is a 29 y.o. female comes to the urgent care with few days history of shortness of breath and cough.  Patient was diagnosed with pulmonary embolism on 11/22.  She is currently on rivaroxaban and she is tolerating that with no bleeding complications.  The pulmonary embolism was provoked by smoking and birth control.  Patient denies any fever or chills.  No hemoptysis.  No chest tightness or wheezing.  No trauma to the chest.  Patient is short of breath both on exertion or at rest.  No cough pain.  HPI  Past Medical History:  Diagnosis Date   Scoliosis     Patient Active Problem List   Diagnosis Date Noted   Pulmonary embolus (HCC) 12/02/2019   Nexplanon insertion 08/24/2015   Rectal bleeding 03/07/2012   Melena 03/07/2012   Abdominal pain, other specified site 03/07/2012    Past Surgical History:  Procedure Laterality Date   BACK SURGERY     age 44   WISDOM TOOTH EXTRACTION  Jan 2014    OB History   No obstetric history on file.      Home Medications    Prior to Admission medications   Medication Sig Start Date End Date Taking? Authorizing Provider  benzonatate (TESSALON) 100 MG capsule Take 1 capsule (100 mg total) by mouth every 8 (eight) hours. 12/26/19   Tieler Cournoyer, Britta Mccreedy, MD  RIVAROXABAN Carlena Hurl) VTE STARTER PACK (15 & 20 MG) Follow package directions: Take one 15mg  tablet by mouth twice a day. On day 22, switch to one 20mg  tablet once a day. Take with food. 12/04/19   , MD    Family History Family History  Problem Relation Age of Onset   Colon cancer Neg Hx     Social History Social History   Tobacco Use   Smoking status: Current Some Day Smoker    Types: E-cigarettes   Smokeless tobacco: Never Used   Tobacco comment: 1 pack  lasts a week  Substance Use Topics   Alcohol use: Yes    Comment: occ   Drug use: No     Allergies   Penicillins   Review of Systems Review of Systems  Constitutional: Positive for fatigue. Negative for activity change, chills and fever.  HENT: Negative.   Respiratory: Positive for cough and shortness of breath. Negative for chest tightness and wheezing.   Cardiovascular: Positive for chest pain.  Gastrointestinal: Negative.   Neurological: Positive for dizziness. Negative for light-headedness, numbness and headaches.     Physical Exam Triage Vital Signs ED Triage Vitals  Enc Vitals Group     BP 12/26/19 1211 140/87     Pulse Rate 12/26/19 1211 (!) 124     Resp 12/26/19 1211 20     Temp 12/26/19 1211 99.2 F (37.3 C)     Temp src --      SpO2 12/26/19 1211 96 %     Weight --      Height --      Head Circumference --      Peak Flow --      Pain Score 12/26/19 1209 0     Pain Loc --      Pain Edu? --      Excl.  in GC? --    No data found.  Updated Vital Signs BP 140/87    Pulse (!) 124    Temp 99.2 F (37.3 C)    Resp 20    LMP  (LMP Unknown)    SpO2 96%   Visual Acuity Right Eye Distance:   Left Eye Distance:   Bilateral Distance:    Right Eye Near:   Left Eye Near:    Bilateral Near:     Physical Exam Vitals and nursing note reviewed.  Constitutional:      General: She is not in acute distress.    Appearance: She is not ill-appearing.  Pulmonary:     Breath sounds: Examination of the right-lower field reveals decreased breath sounds. Decreased breath sounds present. No wheezing, rhonchi or rales.  Abdominal:     General: Bowel sounds are normal.     Palpations: Abdomen is soft.  Neurological:     Mental Status: She is alert.      UC Treatments / Results  Labs (all labs ordered are listed, but only abnormal results are displayed) Labs Reviewed - No data to display  EKG   Radiology No results found.  Procedures Procedures  (including critical care time)  Medications Ordered in UC Medications - No data to display  Initial Impression / Assessment and Plan / UC Course  I have reviewed the triage vital signs and the nursing notes.  Pertinent labs & imaging results that were available during my care of the patient were reviewed by me and considered in my medical decision making (see chart for details).     1.  Pulmonary infarct: Chest x-ray done in the urgent care shows new right upper lobe airspace opacity worrisome for previous infarct versus pneumonia: Given that the patient had a heavy burden pulmonary embolic event and having completed a 10-day course of antibiotics, this infiltrate or opacity is most likely a pulmonary infarct. Tessalon Perles as needed for cough Tylenol as needed for pain Worsening symptoms please return follow-up with primary care physician Final Clinical Impressions(s) / UC Diagnoses   Final diagnoses:  Pulmonary infarct (HCC)  Dyspnea on exertion     Discharge Instructions     Deep breathing exercises. Increase physical activity as tolerated. Tylenol as needed for pain Take medications as tolerated.    ED Prescriptions    Medication Sig Dispense Auth. Provider   benzonatate (TESSALON) 100 MG capsule Take 1 capsule (100 mg total) by mouth every 8 (eight) hours. 21 capsule Mischell Branford, Britta Mccreedy, MD     PDMP not reviewed this encounter.   Merrilee Jansky, MD 12/31/19 1115

## 2020-01-01 MED FILL — buPROPion HCL ER (XL) 150 M: 150 | 90 days supply | Qty: 90 | Fill #0

## 2020-01-21 ENCOUNTER — Other Ambulatory Visit: Payer: Self-pay

## 2020-01-21 ENCOUNTER — Inpatient Hospital Stay (HOSPITAL_COMMUNITY): Payer: No Typology Code available for payment source

## 2020-01-21 ENCOUNTER — Ambulatory Visit (HOSPITAL_COMMUNITY)
Admission: RE | Admit: 2020-01-21 | Discharge: 2020-01-21 | Disposition: A | Discharge status: Home / Self Care | Payer: No Typology Code available for payment source | Source: Ambulatory Visit | Attending: Hematology | Admitting: Hematology

## 2020-01-21 ENCOUNTER — Other Ambulatory Visit (HOSPITAL_COMMUNITY): Payer: Self-pay

## 2020-01-21 ENCOUNTER — Inpatient Hospital Stay (HOSPITAL_COMMUNITY): Payer: No Typology Code available for payment source | Attending: Hematology | Admitting: Hematology

## 2020-01-21 ENCOUNTER — Encounter (HOSPITAL_COMMUNITY): Payer: Self-pay | Admitting: Hematology

## 2020-01-21 VITALS — BP 118/82 | HR 106 | Temp 96.9°F | Resp 17 | Ht 67.0 in | Wt 189.8 lb

## 2020-01-21 DIAGNOSIS — Z79899 Other long term (current) drug therapy: Secondary | ICD-10-CM | POA: Insufficient documentation

## 2020-01-21 DIAGNOSIS — R059 Cough, unspecified: Secondary | ICD-10-CM | POA: Insufficient documentation

## 2020-01-21 DIAGNOSIS — R918 Other nonspecific abnormal finding of lung field: Secondary | ICD-10-CM | POA: Insufficient documentation

## 2020-01-21 DIAGNOSIS — I2699 Other pulmonary embolism without acute cor pulmonale: Secondary | ICD-10-CM

## 2020-01-21 DIAGNOSIS — R0602 Shortness of breath: Secondary | ICD-10-CM | POA: Diagnosis not present

## 2020-01-21 DIAGNOSIS — Z7901 Long term (current) use of anticoagulants: Secondary | ICD-10-CM | POA: Diagnosis not present

## 2020-01-21 DIAGNOSIS — Z86711 Personal history of pulmonary embolism: Secondary | ICD-10-CM | POA: Insufficient documentation

## 2020-01-21 LAB — D-DIMER, QUANTITATIVE: D-Dimer, Quant: 1.33 ug/mL-FEU — ABNORMAL HIGH (ref 0.00–0.50)

## 2020-01-21 NOTE — Patient Instructions (Signed)
[facility] at [facility] Discharge Instructions  You were seen and examined today by Dr. Ellin Saba. Dr. Ellin Saba is a hematologist that specializes in blood disorders. Dr. Ellin Saba discussed your past medical history, family history of blood disorders and cancers, and the events that led to you being here today.   You were referred to Dr. Ellin Saba due to a pulmonary embolism, which is a blood clot in your lung. This was most likely related to your oral contraception, but given that you have been on Xarelto, you can restart your oral contraception. You should be on Xarelto for at least 6 months.  Dr. Ellin Saba has recommended additional lab work to see if there is a physical or genetic link to your PE. Dr. Ellin Saba will reassess the clot prior to discontinuing Xarelto. Dr. Ellin Saba has also recommended repeating the Chest X-Ray today, given your continued cough.  Dr. Ellin Saba will see you back in 2 to 3 weeks.   Thank you for choosing [facility] at [facility] to provide your oncology and hematology care.  To afford each patient quality time with our provider, please arrive at least 15 minutes before your scheduled appointment time.   If you have a lab appointment with the Cancer Center please come in thru the Main Entrance and check in at the main information desk.  You need to re-schedule your appointment should you arrive 10 or more minutes late.  We strive to give you quality time with our providers, and arriving late affects you and other patients whose appointments are after yours.  Also, if you no show three or more times for appointments you may be dismissed from the clinic at the providers discretion.     Again, thank you for choosing [facility].  Our hope is that these requests will decrease the amount of time that you wait before being seen by our physicians.        _____________________________________________________________  Should you have questions after your visit to [facility], please contact our office at [phone] and follow the prompts.  Our office hours are 8:00 a.m. and 4:30 p.m. Monday - Friday.  Please note that voicemails left after 4:00 p.m. may not be returned until the following business day.  We are closed weekends and major holidays.  You do have access to a nurse 24-7, just call the main number to the clinic [phone] and do not press any options, hold on the line and a nurse will answer the phone.    For prescription refill requests, have your pharmacy contact our office and allow 72 hours.    Due to Covid, you will need to wear a mask upon entering the hospital. If you do not have a mask, a mask will be given to you at the Main Entrance upon arrival. For doctor visits, patients may have 1 support person age 83 or older with them. For treatment visits, patients can not have anyone with them due to social distancing guidelines and our immunocompromised population.

## 2020-01-21 NOTE — Progress Notes (Signed)
[facility] [address] [place], [place] [id number]   CLINIC:  Medical Oncology/Hematology  Patient Care Team: Elfredia NevinsFusco, Lawrence, MD as PCP - General (Internal Medicine) West BaliFields, Sandi L, MD (Inactive) as Attending Physician (Gastroenterology)  CHIEF COMPLAINTS/PURPOSE OF CONSULTATION:  Evaluation of PE with pulmonary infarct  HISTORY OF PRESENTING ILLNESS:  Shirley Peters 30 y.o. female is here because of evaluation of PE with pulmonary infarct, at the [facility] of [facility]. She came to [facility] on 12/02/2019 with SOB which started around 08/2019 and CP with radiation to neck since 11/16/2019. She was noted to be on OCP and smoking. She subsequently [facility]/26/2019 after having a few days of SOB and cough.  Today she reports feeling okay. She continues having a cough when she gets tachycardic and has palpitations; she was informed in the urgent care that her cough will persist until her pulmonary infarct heals. The cough is dry with occasional white sputum and worse with walking or talking. The cough [facility] 3 weeks [facility]. She was given Tessalon in the urgent care but denies that it has helped her cough. She took doxycycline for 10 days in the beginning of December, but denies that it helped either. She still has orthopnea when she lies on her back and some chest tightness when she lies on her right side. She continues taking Xarelto and tolerating it well; she denies having nosebleeds, hematuria or hematochezia, though she now has menses lasting 10 days. She stopped taking OCP; she was taking OCP from April to November 2021 after having a progesterone implant since 2012 which was replaced every 3 years for a total of 9 years. She stopped vaping when she was diagnosed with the PE; she vaped for 2 years and smoked socially prior to that. Her CP has improved, though her left lateral ribs still hurt after coughing. She denies  prior history of PE, DVT, miscarriages or airway disease. She denies recent infections, F/C, night sweats, rashes or joint swelling. She has gone back to work. She had COVID in April 2021 and she is vaccinated.  Her mother passed away from a PE when she was 8 months pregnant; her father had prostate cancer; her PGM had breast cancer; paternal aunt had renal cell carcinoma. She denies family history of lupus or RA. 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   MEDICAL HISTORY:  Past Medical History:  Diagnosis Date  . Scoliosis     SURGICAL HISTORY: Past Surgical History:  Procedure Laterality Date  . BACK SURGERY     age 30  . WISDOM TOOTH EXTRACTION  Jan 2014    SOCIAL HISTORY: Social History   Socioeconomic History  . Marital status: Single    Spouse name: Not on file  . Number of children: 1  . Years of education: Not on file  . Highest education level: Not on file  Occupational History  . 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 Smoking status: Current Some Day Smoker  . Smokeless tobacco: Never Used  . Tobacco comment: 1 pack lasts a week  Substance and Sexual Activity  . Alcohol use: Yes    Comment: occ  . Drug use: No  . Sexual activity: Yes    Birth control/protection: Implant    Comment: irregular menses  Other Topics Concern  . Not on file  Social History Narrative   Son turned 4 on Feb 5  Social Determinants of Health   Financial Resource Strain: Low Risk   . Difficulty of Paying Living Expenses: Not hard at all  Food Insecurity: No Food Insecurity  . Worried About Programme researcher, broadcasting/film/video in the Last Year: Never true  . Ran Out of Food in the Last Year: Never true  Transportation Needs: No Transportation Needs  . Lack of Transportation (Medical): No  . Lack of Transportation (Non-Medical): No  Physical Activity: Insufficiently Active  . Days of Exercise per Week: 4 days  . Minutes of Exercise per Session: 30 min  Stress: No Stress Concern Present  .  Feeling of Stress : Not at all  Social Connections: Moderately Isolated  . Frequency of Communication with Friends and Family: More than three times a week  . Frequency of Social Gatherings with Friends and Family: More than three times a week  . Attends Religious Services: Never  . Active Member of Clubs or Organizations: No  . Attends Banker Meetings: Never  . Marital Status: Living with partner  Intimate Partner Violence: Not At Risk  . Fear of Current or Ex-Partner: No  . Emotionally Abused: No  . Physically Abused: No  . Sexually Abused: No    FAMILY HISTORY: Family History  Problem Relation Age of Onset  . Colon cancer Neg Hx     ALLERGIES:  is allergic to penicillins.  MEDICATIONS:  Current Outpatient Medications  Medication Sig Dispense Refill  . Ascorbic Acid (VITAMIN C) 1000 MG tablet Take 1,000 mg by mouth daily.    Marland Kitchen buPROPion (WELLBUTRIN XL) 150 MG 24 hr tablet Take 150 mg by mouth daily.    Marland Kitchen RIVAROXABAN (XARELTO) VTE STARTER PACK (15 & 20 MG) Follow package directions: Take one 15mg  tablet by mouth twice a day. On day 22, switch to one 20mg  tablet once a day. Take with food. 51 each 0   No current facility-administered medications for this visit.    REVIEW OF SYSTEMS:   Review of Systems  Constitutional: Positive for appetite change (100%) and fatigue (50%). Negative for chills, diaphoresis and fever.  HENT:   Negative for nosebleeds.   Respiratory: Positive for cough (dry w/ occasional white sputum) and shortness of breath (w/ exertion; orthopnea).   Cardiovascular: Positive for chest pain (L lateral rib pain w/ coughing) and palpitations (w/ exertion and talking).  Gastrointestinal: Negative for blood in stool.  Genitourinary: Positive for menstrual problem (bleeding for 10 days). Negative for hematuria.   Musculoskeletal: Negative for arthralgias.  Skin: Negative for rash.  All other systems reviewed and are negative.    PHYSICAL  EXAMINATION: ECOG PERFORMANCE STATUS: 1 - Symptomatic but completely ambulatory  Vitals:   01/21/20 0813  BP: 118/82  Pulse: (!) 106  Resp: 17  Temp: (!) 96.9 F (36.1 C)  SpO2: 97%   Filed Weights   01/21/20 0813  Weight: 189 lb 12.8 oz (86.1 kg)   Physical Exam Vitals reviewed.  Constitutional:      Appearance: Normal appearance.  Cardiovascular:     Rate and Rhythm: Normal rate and regular rhythm.     Pulses: Normal pulses.     Heart sounds: Normal heart sounds.  Pulmonary:     Effort: Pulmonary effort is normal.     Breath sounds: Normal breath sounds.  Chest:  Breasts:     Right: No axillary adenopathy or supraclavicular adenopathy.     Left: No axillary adenopathy or supraclavicular adenopathy.    Abdominal:  Palpations: Abdomen is soft. There is no mass.     Tenderness: There is no abdominal tenderness.  Lymphadenopathy:     Cervical: No cervical adenopathy.     Upper Body:     Right upper body: No supraclavicular, axillary or pectoral adenopathy.     Left upper body: No supraclavicular, axillary or pectoral adenopathy.  Neurological:     General: No focal deficit present.     Mental Status: She is alert and oriented to person, place, and time.  Psychiatric:        Mood and Affect: Mood normal.        Behavior: Behavior normal.      LABORATORY DATA:  I have reviewed the data as listed Recent Results (from the past 2160 hour(s))  CBC     Status: Abnormal   Collection Time: 12/04/19  4:01 AM  Result Value Ref Range   WBC 12.4 (H) 4.0 - 10.5 K/uL   RBC 4.36 3.87 - 5.11 MIL/uL   Hemoglobin 12.8 12.0 - 15.0 g/dL   HCT 98.3 38.2 - 50.5 %   MCV 90.6 80.0 - 100.0 fL   MCH 29.4 26.0 - 34.0 pg   MCHC 32.4 30.0 - 36.0 g/dL   RDW 39.7 67.3 - 41.9 %   Platelets 282 150 - 400 K/uL   nRBC 0.0 0.0 - 0.2 %    Comment: Performed at [facility], [address], [place], [place] [address]  Basic metabolic panel     Status: Abnormal   Collection Time:  12/04/19  4:01 AM  Result Value Ref Range   Sodium 136 135 - 145 mmol/L   Potassium 4.1 3.5 - 5.1 mmol/L   Chloride 101 98 - 111 mmol/L   CO2 25 22 - 32 mmol/L   Glucose, Bld 117 (H) 70 - 99 mg/dL    Comment: Glucose reference range applies only to samples taken after fasting for at least 8 hours.   BUN 8 6 - 20 mg/dL   Creatinine, Ser 4.09 0.44 - 1.00 mg/dL   Calcium 8.4 (L) 8.9 - 10.3 mg/dL   GFR, Estimated >73 >53 mL/min    Comment: (NOTE) Calculated using the CKD-EPI Creatinine Equation (2021)    Anion gap 10 5 - 15    Comment: Performed at [facility], [address], [place], [place] [address]    RADIOGRAPHIC STUDIES: I have personally reviewed the radiological images as listed and agreed with the findings in the report. DG Chest 2 View  Result Date: 12/26/2019 CLINICAL DATA:  Shortness of breath and cough EXAM: CHEST - 2 VIEW COMPARISON:  12/02/19 FINDINGS: Stable cardiomediastinal contours. Asymmetric elevation of the right hemidiaphragm. Airspace opacity within the right upper lobe is new from previous exam. Left lung clear. Scoliosis of the thoracolumbar spine with posterior spinal fixation hardware present. IMPRESSION: 1. New right upper lobe airspace opacity which may represent an area of previous pulmonary infarct versus pneumonia 2. Persistent asymmetric elevation of the right hemidiaphragm. Electronically Signed   By: Signa Kell M.D.   On: 12/26/2019 12:25    ASSESSMENT:  1.  Right-sided pulmonary embolism without cor pulmonale and pulmonary infarct: - Patient on progesterone only implant since 2012, switched to combination oral contraceptive pill norethindrone, ethynyl estradiol on 04/11/2019. - Presentation to the ER with shortness of breath, CT angio on 12/02/2019 showing marked amount of intraluminal low-attenuation in the right pulmonary artery extending into multiple upper lobe, middle lobe and lower lobe branches.  No evidence  of saddle embolus.  No evidence of  heart strain. - Doppler of the lower extremities on 12/03/2019 was negative for DVT. - No prior history of miscarriages/lupus/lupus anticoagulant.  2.  Social/family history: - She works as a Agricultural engineer at [employer].  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 -Mother died of pulmonary embolism when she was 8 months pregnant. - Father had prostate cancer.  Paternal grandmother had breast cancer and paternal aunt had kidney cancer.    PLAN:  1.  Weekly provoked pulmonary embolism: - She had pulmonary embolism while she was on combination contraceptive pills. - She will continue Xarelto for at least 6 months. - She has positive family history when her mother died of pulmonary embolism when she was 8 months pregnant. - We will check factor V Leiden, prothrombin gene mutation, anticardiolipin antibodies, antibeta-2 glycoprotein 1 antibodies and lupus anticoagulant today. - We will plan to check protein C, S, Antithrombin III when she comes off of Xarelto. - RTC 2 to 3 weeks for follow-up.  2.  Cough and shortness of breath on exertion: - She has dry cough which started 3 weeks after discharge from the hospital following pulmonary embolism. - She was evaluated at urgent care on 12/26/2019.  Chest x-ray showed right upper lobe infiltrate.  She was given Tessalon.  Prior to that she was treated with doxycycline. - She reports no improvement in cough. - I have repeated chest x-ray today which showed improvement in the right lung opacity.  Other parts of the lungs are normal.    All questions were answered. The patient knows to call the clinic with any problems, questions or concerns.   Doreatha Massed, MD 01/21/20 9:04 AM  [facility] [phone]   I, Drue Second, am acting as a scribe for Dr. Payton Mccallum.  I, Doreatha Massed MD, have reviewed the above documentation for accuracy and completeness, and I  agree with the above.

## 2020-01-22 ENCOUNTER — Ambulatory Visit (HOSPITAL_COMMUNITY): Payer: No Typology Code available for payment source | Admitting: Hematology

## 2020-01-22 LAB — BETA-2-GLYCOPROTEIN I ABS, IGG/M/A
Beta-2 Glyco I IgG: <9 GPI IgG units (ref 0–20)
Beta-2-Glycoprotein I IgA: <9 GPI IgA units (ref 0–25)
Beta-2-Glycoprotein I IgM: <9 GPI IgM units (ref 0–32)

## 2020-01-23 LAB — CARDIOLIPIN ANTIBODIES, IGG, IGM, IGA
Anticardiolipin IgA: <9 APL U/mL (ref 0–11)
Anticardiolipin IgG: <9 GPL U/mL (ref 0–14)
Anticardiolipin IgM: <9 MPL U/mL (ref 0–12)

## 2020-01-23 LAB — LUPUS ANTICOAGULANT PANEL
DRVVT: 118.4 s — ABNORMAL HIGH (ref 0.0–47.0)
PTT Lupus Anticoagulant: 57.4 s — ABNORMAL HIGH (ref 0.0–51.9)

## 2020-01-23 LAB — PTT-LA INCUB MIX: PTT-LA Incub Mix: 51.2 s — ABNORMAL HIGH (ref 0.0–48.9)

## 2020-01-23 LAB — PTT-LA MIX: PTT-LA Mix: 45.9 s (ref 0.0–48.9)

## 2020-01-23 LAB — DRVVT CONFIRM: dRVVT Confirm: 2 ratio — ABNORMAL HIGH (ref 0.8–1.2)

## 2020-01-23 LAB — HEXAGONAL PHASE PHOSPHOLIPID: Hexagonal Phase Phospholipid: 3 s (ref 0–11)

## 2020-01-23 LAB — DRVVT MIX: dRVVT Mix: 71.5 s — ABNORMAL HIGH (ref 0.0–40.4)

## 2020-01-24 ENCOUNTER — Encounter (HOSPITAL_COMMUNITY): Payer: Self-pay

## 2020-01-27 LAB — PROTHROMBIN GENE MUTATION

## 2020-01-27 LAB — FACTOR 5 LEIDEN

## 2020-01-27 MED FILL — XARELTO 20 MG TABLET: 20 | 30 days supply | Qty: 30 | Fill #1

## 2020-02-12 ENCOUNTER — Ambulatory Visit (HOSPITAL_COMMUNITY): Payer: No Typology Code available for payment source | Admitting: Hematology

## 2020-02-19 ENCOUNTER — Other Ambulatory Visit: Payer: Self-pay

## 2020-02-19 ENCOUNTER — Inpatient Hospital Stay (HOSPITAL_COMMUNITY): Payer: No Typology Code available for payment source | Attending: Hematology | Admitting: Hematology

## 2020-02-19 VITALS — BP 123/76 | HR 102 | Temp 98.6°F | Resp 18 | Wt 194.7 lb

## 2020-02-19 DIAGNOSIS — R000 Tachycardia, unspecified: Secondary | ICD-10-CM | POA: Insufficient documentation

## 2020-02-19 DIAGNOSIS — Z8616 Personal history of COVID-19: Secondary | ICD-10-CM | POA: Diagnosis not present

## 2020-02-19 DIAGNOSIS — R5383 Other fatigue: Secondary | ICD-10-CM | POA: Insufficient documentation

## 2020-02-19 DIAGNOSIS — Z79899 Other long term (current) drug therapy: Secondary | ICD-10-CM | POA: Insufficient documentation

## 2020-02-19 DIAGNOSIS — R002 Palpitations: Secondary | ICD-10-CM | POA: Diagnosis not present

## 2020-02-19 DIAGNOSIS — Z7901 Long term (current) use of anticoagulants: Secondary | ICD-10-CM | POA: Diagnosis not present

## 2020-02-19 DIAGNOSIS — R0602 Shortness of breath: Secondary | ICD-10-CM | POA: Insufficient documentation

## 2020-02-19 DIAGNOSIS — F17210 Nicotine dependence, cigarettes, uncomplicated: Secondary | ICD-10-CM | POA: Insufficient documentation

## 2020-02-19 DIAGNOSIS — I2699 Other pulmonary embolism without acute cor pulmonale: Secondary | ICD-10-CM | POA: Insufficient documentation

## 2020-02-19 NOTE — Patient Instructions (Signed)
[facility] at [facility] Discharge Instructions  You were seen today by Dr. Ellin Saba. He went over your recent results. Your blood work showed presence of lupus anticoagulant antibodies, but there were no cardiolipin or beta-2 glycoprotein antibodies, or factor 5 Leiden or prothrombin gene mutations. You will be scheduled to have a CT scan of your chest done before your next visit. Continue taking Xarelto daily. Dr. Ellin Saba will see you back in 4 months for labs and follow up.   Thank you for choosing [facility] at [facility] to provide your oncology and hematology care.  To afford each patient quality time with our provider, please arrive at least 15 minutes before your scheduled appointment time.   If you have a lab appointment with the Cancer Center please come in thru the Main Entrance and check in at the main information desk  You need to re-schedule your appointment should you arrive 10 or more minutes late.  We strive to give you quality time with our providers, and arriving late affects you and other patients whose appointments are after yours.  Also, if you no show three or more times for appointments you may be dismissed from the clinic at the providers discretion.     Again, thank you for choosing [facility].  Our hope is that these requests will decrease the amount of time that you wait before being seen by our physicians.       _____________________________________________________________  Should you have questions after your visit to [facility], please contact our office at [phone] between the hours of 8:00 a.m. and 4:30 p.m.  Voicemails left after 4:00 p.m. will not be returned until the following business day.  For prescription refill requests, have your pharmacy contact our office and allow 72 hours.    Cancer Center Support Programs:   > Cancer Support Group  2nd Tuesday of the month 1pm-2pm,  Journey Room

## 2020-02-19 NOTE — Progress Notes (Signed)
Shirley Peters [address], [place] [address]   CLINIC:  Medical Oncology/Hematology  PCP:  Shirley Nevins, MD [address] / [place] [address]  [phone]  REASON FOR VISIT:  Follow-up for PE with pulmonary infarct  PRIOR THERAPY: None  CURRENT THERAPY: Xarelto daily  INTERVAL HISTORY:  Shirley Peters, a 30 y.o. female, returns for routine follow-up for her PE with pulmonary infarct. Shirley Peters was last seen on 01/21/2020.  Today she reports feeling well. She reports that the week of 01/18, she was infected with COVID, but once that cleared up, her cough resolved. She is tolerating Xarelto well and denies easy bruising, nosebleeds, hematuria or hematochezia. She continues having intermittent palpitations and tachycardia multiple times throughout the day even at rest.   REVIEW OF SYSTEMS:  Review of Systems  Constitutional: Positive for appetite change (75%) and fatigue (75%).  HENT:   Negative for nosebleeds.   Respiratory: Positive for shortness of breath (w/ exertion). Negative for cough.   Cardiovascular: Positive for palpitations (intermittent).  Gastrointestinal: Negative for blood in stool.  Genitourinary: Negative for hematuria.   Hematological: Does not bruise/bleed easily.  All other systems reviewed and are negative.   PAST MEDICAL/SURGICAL HISTORY:  Past Medical History:  Diagnosis Date  . Scoliosis    Past Surgical History:  Procedure Laterality Date  . BACK SURGERY     age 42  . WISDOM TOOTH EXTRACTION  Jan 2014    SOCIAL HISTORY:  Social History   Socioeconomic History  . Marital status: Single    Spouse name: Not on file  . Number of children: 1  . Years of education: Not on file  . Highest education level: Not on file  Occupational History  . 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 Smoking status: Current Some Day Smoker  . Smokeless tobacco: Never Used  . Tobacco comment: 1 pack lasts a week  Substance  and Sexual Activity  . Alcohol use: Yes    Comment: occ  . Drug use: No  . Sexual activity: Yes    Birth control/protection: Implant    Comment: irregular menses  Other Topics Concern  . Not on file  Social History Narrative   Son turned 4 on Feb 5         Social Determinants of Health   Financial Resource Strain: Low Risk   . Difficulty of Paying Living Expenses: Not hard at all  Food Insecurity: No Food Insecurity  . Worried About Programme researcher, broadcasting/film/video in the Last Year: Never true  . Ran Out of Food in the Last Year: Never true  Transportation Needs: No Transportation Needs  . Lack of Transportation (Medical): No  . Lack of Transportation (Non-Medical): No  Physical Activity: Insufficiently Active  . Days of Exercise per Week: 4 days  . Minutes of Exercise per Session: 30 min  Stress: No Stress Concern Present  . Feeling of Stress : Not at all  Social Connections: Moderately Isolated  . Frequency of Communication with Friends and Family: More than three times a week  . Frequency of Social Gatherings with Friends and Family: More than three times a week  . Attends Religious Services: Never  . Active Member of Clubs or Organizations: No  . Attends Banker Meetings: Never  . Marital Status: Living with partner  Intimate Partner Violence: Not At Risk  . Fear of Current or Ex-Partner: No  . Emotionally Abused: No  .  Physically Abused: No  . Sexually Abused: No    FAMILY HISTORY:  Family History  Problem Relation Age of Onset  . Colon cancer Neg Hx     CURRENT MEDICATIONS:  Current Outpatient Medications  Medication Sig Dispense Refill  . Ascorbic Acid (VITAMIN C) 1000 MG tablet Take 1,000 mg by mouth daily.    Marland Kitchen buPROPion (WELLBUTRIN XL) 150 MG 24 hr tablet Take 150 mg by mouth daily.    Marland Kitchen RIVAROXABAN (XARELTO) VTE STARTER PACK (15 & 20 MG) Follow package directions: Take one 15mg  tablet by mouth twice a day. On day 22, switch to one 20mg  tablet once a  day. Take with food. 51 each 0   No current facility-administered medications for this visit.    ALLERGIES:  Allergies  Allergen Reactions  . Penicillins     Patient doesn't know her reaction, states that she has been told she is allergic since she was young    PHYSICAL EXAM:  Performance status (ECOG): 1 - Symptomatic but completely ambulatory  Vitals:   02/19/20 1501  BP: 123/76  Pulse: (!) 102  Resp: 18  Temp: 98.6 F (37 C)  SpO2: 97%   Wt Readings from Last 3 Encounters:  02/19/20 194 lb 11.2 oz (88.3 kg)  01/21/20 189 lb 12.8 oz (86.1 kg)  12/02/19 189 lb 13.1 oz (86.1 kg)   Physical Exam Vitals reviewed.  Constitutional:      Appearance: Normal appearance.  Cardiovascular:     Rate and Rhythm: Normal rate and regular rhythm.     Pulses: Normal pulses.     Heart sounds: Normal heart sounds.  Pulmonary:     Effort: Pulmonary effort is normal.     Breath sounds: Normal breath sounds.  Musculoskeletal:     Right lower leg: No edema.     Left lower leg: No edema.  Neurological:     General: No focal deficit present.     Mental Status: She is alert and oriented to person, place, and time.  Psychiatric:        Mood and Affect: Mood normal.        Behavior: Behavior normal.     LABORATORY DATA:  I have reviewed the labs as listed.  CBC Latest Ref Rng & Units 12/04/2019 12/03/2019 12/02/2019  WBC 4.0 - 10.5 K/uL 12.4(H) 9.2 9.7  Hemoglobin 12.0 - 15.0 g/dL 12/05/2019 12/04/2019 16.3  Hematocrit 36.0 - 46.0 % 39.5 38.3 41.6  Platelets 150 - 400 K/uL 282 272 298   CMP Latest Ref Rng & Units 12/04/2019 12/02/2019 02/27/2012  Glucose 70 - 99 mg/dL 12/04/2019) 90 -  BUN 6 - 20 mg/dL 8 9 -  Creatinine 02/29/2012 - 1.00 mg/dL 935(T 0.17 -  Sodium 7.93 - 145 mmol/L 136 137 -  Potassium 3.5 - 5.1 mmol/L 4.1 3.7 -  Chloride 98 - 111 mmol/L 101 103 -  CO2 22 - 32 mmol/L 25 25 -  Calcium 8.9 - 10.3 mg/dL 9.03) 009) 9.6  Total Protein 6.5 - 8.1 g/dL - 7.3 7.3  Total Bilirubin 0.3 -  1.2 mg/dL - 2.3(R) 0.3  Alkaline Phos 38 - 126 U/L - 58 74  AST 15 - 41 U/L - 15 10  ALT 0 - 44 U/L - 17 8      Component Value Date/Time   RBC 4.36 12/04/2019 0401   MCV 90.6 12/04/2019 0401   MCV 84.3 02/27/2012 0000   MCH 29.4 12/04/2019 0401   MCHC 32.4 12/04/2019  0401   RDW 12.6 12/04/2019 0401   RDW 13.6 02/27/2012 0000   LYMPHSABS 2.5 12/03/2019 0130   MONOABS 0.7 12/03/2019 0130   EOSABS 0.1 12/03/2019 0130   BASOSABS 0.0 12/03/2019 0130    DIAGNOSTIC IMAGING:  I have independently reviewed the scans and discussed with the patient. DG Chest 2 View  Result Date: 01/21/2020 CLINICAL DATA:  30 year old female with pulmonary embolus in November. Persistent shortness of breath and cough. EXAM: CHEST - 2 VIEW COMPARISON:  Chest radiographs 12/26/2019. CTA chest 12/02/2019 and earlier. FINDINGS: Chronic scoliosis and posterior spinal rods appear stable. Lung volumes and mediastinal contours are stable. Visualized tracheal air column is within normal limits. No pneumothorax, pulmonary edema, pleural effusion or consolidation. Multifocal streaky right lung opacity has regressed since December. Residual scarring suspected in those areas. And no areas of new opacity or worsening ventilation are identified. Negative visible bowel gas pattern. No acute osseous abnormality identified. IMPRESSION: 1. Regressed streaky right lung opacity since December with residual scarring suspected. This could be post ischemic or post infectious in this setting. 2. Stable ventilation otherwise. No new cardiopulmonary abnormality. Electronically Signed   By: Odessa Fleming M.D.   On: 01/21/2020 09:31     ASSESSMENT:  1.  Right-sided pulmonary embolism without cor pulmonale and pulmonary infarct: - Patient on progesterone only implant since 2012, switched to combination oral contraceptive pill norethindrone, ethynyl estradiol on 04/11/2019. - Presentation to the ER with shortness of breath, CT angio on 12/02/2019  showing marked amount of intraluminal low-attenuation in the right pulmonary artery extending into multiple upper lobe, middle lobe and lower lobe branches.  No evidence of saddle embolus.  No evidence of heart strain. - Doppler of the lower extremities on 12/03/2019 was negative for DVT. - No prior history of miscarriages/lupus/lupus anticoagulant.  2.  Social/family history: - She works as a Agricultural engineer at [employer].  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 -Mother died of pulmonary embolism when she was 8 months pregnant. - Father had prostate cancer.  Paternal grandmother had breast cancer and paternal aunt had kidney cancer.   PLAN:  1.  Weekly provoked pulmonary embolism: - She had weekly provoked PE while on combination contraceptive pills. -She will continue Xarelto for at least 6 months. -She has positive family history with mother died of pulmonary embolism during pregnancy. -Reviewed labs from 01/21/2020.  Lupus anticoagulant is positive.  However she is on Xarelto.  D-dimer was 1.33.  Beta-2 glycoprotein 1 antibody and anticardiolipin antibody were negative.  Factor V Leiden and prothrombin gene mutation testing was negative. -Recommend continue Xarelto for 6 months.  She will have D-dimer and CT PE protocol done prior to discontinuation of Xarelto. -After 1 month of discontinuation of anticoagulation, I plan to repeat lupus anticoagulant, protein C, protein S, Antithrombin III levels. -She does complain of palpitations which she had for few years.  The last for few seconds and occur at rest.  She had a Holter done on 02/09/2016 which showed normal sinus rhythm and isolated PVCs.  2.  Cough and shortness of breath on exertion: - Chest x-ray at last visit showed improvement in right lung opacity. -She reportedly had Covid infection after the last visit.  She reports complete resolution of cough at this time.  Orders placed this  encounter:  Orders Placed This Encounter  Procedures  . CT ANGIO CHEST PE W OR WO CONTRAST  . D-dimer, quantitative  Derek Jack, MD [place] [phone]   I, Milinda Antis, am acting as a scribe for Dr. Sanda Linger.  I, Derek Jack MD, have reviewed the above documentation for accuracy and completeness, and I agree with the above.

## 2020-02-27 MED FILL — XARELTO 20 MG TABLET: 20 | 30 days supply | Qty: 30 | Fill #2

## 2020-03-10 ENCOUNTER — Ambulatory Visit: Payer: No Typology Code available for payment source | Admitting: Women's Health

## 2020-03-12 ENCOUNTER — Encounter: Payer: Self-pay | Admitting: Cardiology

## 2020-03-12 NOTE — Progress Notes (Signed)
Cardiology Office Note  Date: 03/13/2020   ID: Shirley Peters, DOB 24-May-1990, MRN [id number]  PCP:  Elfredia Nevins, MD  Cardiologist:  Nona Dell, MD Electrophysiologist:  None   Chief Complaint  Patient presents with  . Palpitations    History of Present Illness: Shirley Peters is a 30 y.o. female referred for cardiology consultation by [name] PA-C with [facility] for the evaluation of palpitations.  She states that she has had a history of palpitations and PVCs documented as far back as 2018.  Cardiac monitor in 2018 demonstrated isolated PVCs but no arrhythmias.  She has not required specific medical therapy, generally limiting caffeine and otherwise a strategy of observation.  More recently she has had an increased sense of palpitations, she feels like these are PVCs as before.  She has had no syncope.  History includes pulmonary embolus involving right pulmonary artery in November 2021, no clear evidence of cor pulmonale.  She had been on combination OCP, no obvious provocation otherwise, although subsequently found to be lupus anticoagulant positive.  She is on Xarelto for 8-month course with retesting thereafter per Hematology.  Shortness of breath has been gradually improving since this event, still not completely back to baseline.  She did stop vaping at diagnosis of pulmonary embolus.  I reviewed her medications which are outlined below.  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   Past Medical History:  Diagnosis Date  . Pulmonary embolus [facility])    November 2021  . Scoliosis     Past Surgical History:  Procedure Laterality Date  . BACK SURGERY     age 11  . WISDOM TOOTH EXTRACTION  Jan 2014    Current Outpatient Medications  Medication Sig Dispense Refill  . Ascorbic Acid (VITAMIN C) 1000 MG tablet Take 1,000 mg by mouth daily.    Marland Kitchen buPROPion (WELLBUTRIN XL) 150 MG 24 hr tablet Take 150 mg by mouth daily.    Marland Kitchen  RIVAROXABAN (XARELTO) VTE STARTER PACK (15 & 20 MG) Follow package directions: Take one 15mg  tablet by mouth twice a day. On day 22, switch to one 20mg  tablet once a day. Take with food. 51 each 0   No current facility-administered medications for this visit.   Allergies:  Penicillins   Social History: The patient  reports that she quit smoking about 4 months ago. She has never used smokeless tobacco. She reports previous alcohol use. She reports that she does not use drugs.   Family History: The patient's family history includes Pulmonary embolism in her mother.   ROS: No frank syncope.  Physical Exam: VS:  BP 128/78   Pulse 94   Ht 5\' 7"  (1.702 m)   Wt 195 lb (88.5 kg)   SpO2 99%   BMI 30.54 kg/m , BMI Body mass index is 30.54 kg/m.  Wt Readings from Last 3 Encounters:  03/13/20 195 lb (88.5 kg)  02/19/20 194 lb 11.2 oz (88.3 kg)  01/21/20 189 lb 12.8 oz (86.1 kg)    General: Patient appears comfortable at rest. HEENT: Conjunctiva and lids normal, wearing a mask. Neck: No elevated JVP. Lungs: Clear to auscultation, nonlabored breathing at rest. Cardiac: Regular rate and rhythm, no S3, 2/6 basal systolic murmur, no pericardial rub. Skin: Warm and dry. Neuropsychiatric: Alert and oriented x3, affect grossly appropriate.  ECG:  An ECG dated 12/02/2019 was personally reviewed today and demonstrated:  Sinus  rhythm with rightward axis.  Recent Labwork: 12/02/2019: ALT 17; AST 15 12/04/2019: BUN 8; Creatinine, Ser 0.54; Hemoglobin 12.8; Platelets 282; Potassium 4.1; Sodium 136  December 2021: Hemoglobin 13.8, platelets 376, TSH 3.39  Other Studies Reviewed Today:  Echocardiogram 12/03/2019: 1. Abnormal septal motion . Left ventricular ejection fraction, by  estimation, is 60 to 65%. The left ventricle has normal function. The left  ventricle has no regional wall motion abnormalities. Left ventricular  diastolic parameters were normal.  2. Right ventricular systolic  function is normal. The right ventricular  size is normal.  3. The mitral valve is normal in structure. No evidence of mitral valve  regurgitation. No evidence of mitral stenosis.  4. The aortic valve is normal in structure. Aortic valve regurgitation is  not visualized. No aortic stenosis is present.  5. The inferior vena cava is normal in size with greater than 50%  respiratory variability, suggesting right atrial pressure of 3 mmHg.   Assessment and Plan:  1.  Palpitations with suspected frequent PVCs based on prior documented history of isolated PVCs as of 2018.  She has had no frank syncope.  ECG from November 2021 reviewed.  TSH normal from December 2021.  Echocardiogram demonstrates LVEF 60 to 65%, also RV contraction normal.  Systolic murmur noted in pulmonic position.  Plan at this time is to obtain a 7-day Zio patch for further documentation of rhythm and PVC burden.  No medications are being added at this time.  Further plans to follow.  2.  History of pulmonary embolus involving right pulmonary artery in November 2021.  She is on 65-month course of Xarelto with follow-up in Hematology and plan for subsequent imaging and reassessment of thromboembolic risk.  Lupus anticoagulant noted to be positive.  Medication Adjustments/Labs and Tests Ordered: Current medicines are reviewed at length with the patient today.  Concerns regarding medicines are outlined above.   Tests Ordered: No orders of the defined types were placed in this encounter.   Medication Changes: No orders of the defined types were placed in this encounter.   Disposition:  Follow up test results.  Signed, Jonelle Sidle, MD, [facility] 03/13/2020 9:21 AM    [facility] at [facility] [address], [place], [place] [address] Phone: [phone]; Fax: [phone]

## 2020-03-13 ENCOUNTER — Encounter: Payer: Self-pay | Admitting: Cardiology

## 2020-03-13 ENCOUNTER — Other Ambulatory Visit: Payer: Self-pay

## 2020-03-13 ENCOUNTER — Other Ambulatory Visit: Payer: Self-pay | Admitting: Cardiology

## 2020-03-13 ENCOUNTER — Ambulatory Visit (INDEPENDENT_AMBULATORY_CARE_PROVIDER_SITE_OTHER): Payer: No Typology Code available for payment source

## 2020-03-13 ENCOUNTER — Ambulatory Visit: Payer: No Typology Code available for payment source | Admitting: Cardiology

## 2020-03-13 VITALS — BP 128/78 | HR 94 | Ht 67.0 in | Wt 195.0 lb

## 2020-03-13 DIAGNOSIS — I493 Ventricular premature depolarization: Secondary | ICD-10-CM

## 2020-03-13 DIAGNOSIS — R002 Palpitations: Secondary | ICD-10-CM

## 2020-03-13 DIAGNOSIS — Z86711 Personal history of pulmonary embolism: Secondary | ICD-10-CM

## 2020-03-13 NOTE — Patient Instructions (Signed)
Medication Instructions:  Your physician recommends that you continue on your current medications as directed. Please refer to the Current Medication list given to you today.  *If you need a refill on your cardiac medications before your next appointment, please call your pharmacy*   Lab Work: None today  If you have labs (blood work) drawn today and your tests are completely normal, you will receive your results only by:  MyChart Message (if you have MyChart) OR  A paper copy in the mail If you have any lab test that is abnormal or we need to change your treatment, we will call you to review the results.   Testing/Procedures: Your physician has recommended that you wear an event monitor ( 7 day ZIO) . Event monitors are medical devices that record the hearts electrical activity. Doctors most often [place] these monitors to diagnose arrhythmias. Arrhythmias are problems with the speed or rhythm of the heartbeat. The monitor is a small, portable device. You can wear one while you do your normal daily activities. This is usually used to diagnose what is causing palpitations/syncope (passing out).     Follow-Up: To be determined after Zio results have returned.     ZIO XT- Long Term Monitor Instructions   Your physician has requested you wear your ZIO patch monitor___7____days.   This is a single patch monitor.  [employer] supplies one patch monitor per enrollment.  Additional stickers are not available.   Please do not apply patch if you will be having a Nuclear Stress Test, Echocardiogram, Cardiac CT, MRI, or Chest Xray during the time frame you would be wearing the monitor. The patch cannot be worn during these tests.  You cannot remove and re-apply the ZIO XT patch monitor.   Your ZIO patch monitor will be sent USPS Priority mail from [facility] directly to your home address. The monitor may also be mailed to a PO BOX if home delivery is not available.   It may take 3-5 days  to receive your monitor after you have been enrolled.   Once you have received you monitor, please review enclosed instructions.  Your monitor has already been registered assigning a specific monitor serial # to you.       Do not shower for the first 24 hours.  You may shower after the first 24 hours.   Press button if you feel a symptom. You will hear a small click.  Record Date, Time and Symptom in the Patient Log Book.   When you are ready to remove patch, follow instructions on last 2 pages of Patient Log Book.  Stick patch monitor onto last page of Patient Log Book.   Place Patient Log Book in [place] box.  Use locking tab on box and tape box closed securely.  The [employer] and [employer] has [employer] on it.  Please place in mailbox as soon as possible.  Your physician should have your test results approximately 7 days after the monitor has been mailed back to [facility].   Call [facility] Customer Care at [phone] if you have questions regarding your ZIO XT patch monitor.  Call them immediately if you see an orange light blinking on your monitor.   If your monitor falls off in less than 4 days contact our Monitor department at [phone].  If your monitor becomes loose or falls off after 4 days call [employer] at [phone] for suggestions on securing your monitor.  Thank you for choosing [facility] !

## 2020-03-17 ENCOUNTER — Other Ambulatory Visit: Payer: Self-pay

## 2020-03-17 ENCOUNTER — Encounter: Payer: Self-pay | Admitting: Women's Health

## 2020-03-17 ENCOUNTER — Ambulatory Visit (INDEPENDENT_AMBULATORY_CARE_PROVIDER_SITE_OTHER): Payer: No Typology Code available for payment source | Admitting: Women's Health

## 2020-03-17 VITALS — BP 125/87 | HR 88 | Ht 67.0 in | Wt 196.5 lb

## 2020-03-17 DIAGNOSIS — N920 Excessive and frequent menstruation with regular cycle: Secondary | ICD-10-CM | POA: Diagnosis not present

## 2020-03-17 DIAGNOSIS — Z30430 Encounter for insertion of intrauterine contraceptive device: Secondary | ICD-10-CM | POA: Insufficient documentation

## 2020-03-17 DIAGNOSIS — Z3202 Encounter for pregnancy test, result negative: Secondary | ICD-10-CM

## 2020-03-17 LAB — POCT URINE PREGNANCY: Preg Test, Ur: NEGATIVE

## 2020-03-17 LAB — POCT HEMOGLOBIN: Hemoglobin: 12.3 g/dL (ref 11–14.6)

## 2020-03-17 MED ORDER — LEVONORGESTREL 20 MCG/24HR IU IUD: INTRAUTERINE_SYSTEM | Freq: Once | INTRAUTERINE | Status: AC

## 2020-03-17 NOTE — Progress Notes (Signed)
   IUD INSERTION Patient name: Shirley Peters MRN [id number]  Date of birth: 09-17-1990 Subjective Findings:   Shirley Peters is a 30 y.o. G48P1001 Caucasian female being seen today for insertion of a Mirena IUD.  Heavy periods on xarelto for PE in Nov, was on COCs and vaping. Mom died from PE @ pregnant.  Lupus anticoagulant was positive, however on xarelto, so plan is to repeat when comes off. Right now she is changing super plus tampon q 2hrs, periods last 10-12days, some dizziness. Lng-IUD is cat 2 for PE on anticoagulation and beyond.  Depression screen [facility] 2/9 01/21/2020 04/10/2019  Decreased Interest 0 0  Down, Depressed, Hopeless 0 0  PHQ - 2 Score 0 0  Altered sleeping - 1  Tired, decreased energy - 1  Change in appetite - 0  Feeling bad or failure about yourself  - 0  Trouble concentrating - 0  Moving slowly or fidgety/restless - 0  Suicidal thoughts - 0  PHQ-9 Score - 2  Difficult doing work/chores - Not difficult at all    Patient's last menstrual period was 03/15/2020. Last sexual intercourse was on period now Last pap3/31/21. Results were: NILM w/ HRHPV negative  The risks and benefits of the method and placement have been thouroughly reviewed with the patient and all questions were answered.  Specifically the patient is aware of failure rate of 01/998, expulsion of the IUD and of possible perforation.  The patient is aware of irregular bleeding due to the method and understands the incidence of irregular bleeding diminishes with time.  Signed copy of informed consent in chart.  Pertinent History Reviewed:   Reviewed past medical,surgical, social, obstetrical and family history.  Reviewed problem list, medications and allergies. Objective Findings & Procedure:   Vitals:   03/17/20 0836  BP: 125/87  Pulse: 88  Weight: 196 lb 8 oz (89.1 kg)  Height: 5\' 7"  (1.702 m)  Body mass index is 30.78 kg/m.  Results for orders placed or performed in visit on 03/17/20 (from the  past 24 hour(s))  POCT urine pregnancy   Collection Time: 03/17/20  8:44 AM  Result Value Ref Range   Preg Test, Ur Negative Negative     Time out was performed.  A graves speculum was placed in the vagina.  The cervix was visualized, prepped using Betadine, and grasped with a single tooth tenaculum. The uterus was found to be neutral and it sounded to 8 cm.  Mirena  IUD placed per manufacturer's recommendations. The strings were trimmed to approximately 3 cm. The patient tolerated the procedure well.   Informal transvaginal sonogram was performed and the proper placement of the IUD was verified.  Chaperone: 05/17/20, SNP   Assessment & Plan:   1) Mirena IUD insertion d/t menorrhagia on xarelto The patient was given post procedure instructions, including signs and symptoms of infection and to check for the strings after each menses or each month, and refraining from intercourse or anything in the vagina for 3 days. She was given a care card with date IUD placed, and date IUD to be removed. She is scheduled for a f/u appointment in 4 weeks.  2) Recent PE in Nov- on COCs/vaping, on xarelto x Dec  Orders Placed This Encounter  Procedures  . POCT urine pregnancy    Return in about 4 weeks (around 04/14/2020) for IUD f/u, in person, CNM.  06/14/2020 CNM, [facility] 03/17/2020 9:50 AM

## 2020-03-17 NOTE — Addendum Note (Signed)
Addended by: Colen Darling on: 03/17/2020 10:38 AM   Modules accepted: Orders

## 2020-03-17 NOTE — Patient Instructions (Signed)
 Nothing in vagina for 3 days (no sex, douching, tampons, etc...)  Check your strings once a month to make sure you can feel them, if you are not able to please let us know  If you develop a fever of 100.4 or more in the next few weeks, or if you develop severe abdominal pain, please let us know  Use a backup method of birth control, such as condoms, for 2 weeks     Intrauterine Device Insertion, Care After This sheet gives you information about how to care for yourself after your procedure. Your health care provider may also give you more specific instructions. If you have problems or questions, contact your health care provider. What can I expect after the procedure? After the procedure, it is common to have:  Cramps and pain in the abdomen.  Bleeding. It may be light or heavy. This may last for a few days.  Lower back pain.  Dizziness.  Headaches.  Nausea. Follow these instructions at home:  Before resuming sexual activity, check to make sure that you can feel the IUD string or strings. You should be able to feel the end of the string below the opening of your cervix. If your IUD string is in place, you may resume sexual activity. ? If you had a hormonal IUD inserted more than 7 days after your most recent period started, you will need to use a backup method of birth control for 7 days after IUD insertion. Ask your health care provider whether this applies to you.  Continue to check that the IUD is still in place by feeling for the strings after every menstrual period, or once a month.  An IUD will not protect you from sexually transmitted infections (STIs). Use methods to prevent the exchange of body fluids between partners (barrier protection) every time you have sex. Barrier protection can be used during oral, vaginal, or anal sex. Commonly used barrier methods include: ? Female condom. ? Female condom. ? Dental dam.  Take over-the-counter and prescription medicines only as  told by your health care provider.  Keep all follow-up visits as told by your health care provider. This is important.   Contact a health care provider if:  You feel light-headed or weak.  You have any of the following problems with your IUD string or strings: ? The string bothers or hurts you or your sexual partner. ? You cannot feel the string. ? The string has gotten longer.  You can feel the IUD in your vagina.  You think you may be pregnant, or you miss your menstrual period.  You think you may have a sexually transmitted infection (STI). Get help right away if:  You have flu-like symptoms, such as tiredness (fatigue) and muscle aches.  You have a fever and chills.  You have bleeding that is heavier or lasts longer than a normal menstrual cycle.  You have abnormal or bad-smelling discharge from your vagina.  You develop abdominal pain that is new, is getting worse, or is not in the same area of earlier cramping and pain.  You have pain during sexual activity. Summary  After the procedure, it is common to have cramps and pain in the abdomen. It is also common to have light bleeding or heavier bleeding that is like your menstrual period.  Continue to check that the IUD is still in place by feeling for the strings after every menstrual period, or once a month.  Keep all follow-up visits as   told by your health care provider. This is important.  Contact your health care provider if you have problems with your IUD strings, such as the string getting longer or bothering you or your sexual partner. This information is not intended to replace advice given to you by your health care provider. Make sure you discuss any questions you have with your health care provider. Document Revised: 12/18/2018 Document Reviewed: 12/18/2018 Elsevier Patient Education  2021 Elsevier Inc.  

## 2020-03-17 NOTE — Addendum Note (Signed)
Addended by: Colen Darling on: 03/17/2020 11:13 AM   Modules accepted: Orders

## 2020-03-26 ENCOUNTER — Other Ambulatory Visit (HOSPITAL_COMMUNITY): Payer: Self-pay | Admitting: Family Medicine

## 2020-03-31 ENCOUNTER — Telehealth: Payer: Self-pay

## 2020-03-31 ENCOUNTER — Other Ambulatory Visit (HOSPITAL_BASED_OUTPATIENT_CLINIC_OR_DEPARTMENT_OTHER): Payer: Self-pay

## 2020-03-31 NOTE — Telephone Encounter (Signed)
Pt called stating that she has been spotting for 4 days and cannot feel her string and is wanting to know if she should be seen before her appointment on 04/21/20

## 2020-04-03 ENCOUNTER — Other Ambulatory Visit (HOSPITAL_COMMUNITY)
Admission: RE | Admit: 2020-04-03 | Discharge: 2020-04-03 | Disposition: A | Discharge status: Home / Self Care | Payer: No Typology Code available for payment source | Source: Ambulatory Visit | Attending: Advanced Practice Midwife | Admitting: Advanced Practice Midwife

## 2020-04-03 ENCOUNTER — Other Ambulatory Visit: Payer: Self-pay

## 2020-04-03 ENCOUNTER — Encounter: Payer: Self-pay | Admitting: Advanced Practice Midwife

## 2020-04-03 ENCOUNTER — Ambulatory Visit (INDEPENDENT_AMBULATORY_CARE_PROVIDER_SITE_OTHER): Payer: No Typology Code available for payment source | Admitting: Advanced Practice Midwife

## 2020-04-03 VITALS — BP 131/83 | HR 88 | Wt 193.0 lb

## 2020-04-03 DIAGNOSIS — Z30431 Encounter for routine checking of intrauterine contraceptive device: Secondary | ICD-10-CM | POA: Diagnosis not present

## 2020-04-03 DIAGNOSIS — N898 Other specified noninflammatory disorders of vagina: Secondary | ICD-10-CM | POA: Insufficient documentation

## 2020-04-03 NOTE — Addendum Note (Signed)
Addended by: Moss Mc on: 04/03/2020 01:28 PM   Modules accepted: Orders

## 2020-04-03 NOTE — Progress Notes (Signed)
   GYN VISIT Patient name: Shirley Peters MRN [id number]  Date of birth: 1990-08-08 Chief Complaint:   IUD check (Unable to feel strings, also discharge w/odor, green tint)  History of Present Illness:   Shirley Peters is a 30 y.o. G59P1001 Caucasian female being seen today for IUD check. Reports some greenish d/c with odor that resolved approx 1-2wks ago and then spotting/cramping more recently. Was unable to feel the strings.     Depression screen [facility] 2/9 01/21/2020 04/10/2019  Decreased Interest 0 0  Down, Depressed, Hopeless 0 0  PHQ - 2 Score 0 0  Altered sleeping - 1  Tired, decreased energy - 1  Change in appetite - 0  Feeling bad or failure about yourself  - 0  Trouble concentrating - 0  Moving slowly or fidgety/restless - 0  Suicidal thoughts - 0  PHQ-9 Score - 2  Difficult doing work/chores - Not difficult at all    Patient's last menstrual period was 03/15/2020. The current method of family planning is IUD.  Last pap March 2021. Results were: NILM w/ HRHPV neg; +BV Review of Systems:   Pertinent items are noted in HPI Denies fever/chills, dizziness, headaches, visual disturbances, fatigue, shortness of breath, chest pain, abdominal pain, vomiting, abnormal vaginal discharge/itching/odor/irritation, problems with periods, bowel movements, urination, or intercourse unless otherwise stated above.  Pertinent History Reviewed:  Reviewed past medical,surgical, social, obstetrical and family history.  Reviewed problem list, medications and allergies. Physical Assessment:   Vitals:   04/03/20 1109  BP: 131/83  Pulse: 88  Weight: 193 lb (87.5 kg)  Body mass index is 30.23 kg/m.       Physical Examination:   General appearance: alert, well appearing, and in no distress  Mental status: alert, oriented to person, place, and time  Skin: warm & dry   Cardiovascular: normal heart rate noted  Respiratory: normal respiratory effort, no distress  Abdomen: soft, non-tender   Pelvic:  normal external genitalia, vulva, vagina, cervix, uterus and adnexa; sm folliculitis in L groin; SSE: strings 2-3cm from os; CV collected  Extremities: no edema   Chaperone: Latisha Cresenzo    No results found for this or any previous visit (from the past 24 hour(s)).  Assessment & Plan:  1) IUD check> strings in place  2) Vag d/c> CV swab sent  Meds: No orders of the defined types were placed in this encounter.   No orders of the defined types were placed in this encounter.   Return in about 1 year (around 04/03/2021) for Physical.  Arabella Merles [facility] 04/03/2020 11:56 AM

## 2020-04-07 ENCOUNTER — Other Ambulatory Visit: Payer: Self-pay | Admitting: Advanced Practice Midwife

## 2020-04-07 LAB — CERVICOVAGINAL ANCILLARY ONLY
Bacterial Vaginitis (gardnerella): POSITIVE — AB
Candida Glabrata: NEGATIVE
Candida Vaginitis: NEGATIVE
Chlamydia: NEGATIVE
Comment: NEGATIVE
Comment: NEGATIVE
Comment: NEGATIVE
Comment: NEGATIVE
Comment: NEGATIVE
Comment: NORMAL
Neisseria Gonorrhea: NEGATIVE
Trichomonas: NEGATIVE

## 2020-04-07 MED ORDER — METRONIDAZOLE 500 MG PO TABS: 500.0000 mg | ORAL_TABLET | Freq: Two times a day (BID) | ORAL | 0 refills | Status: DC

## 2020-04-07 MED FILL — METRONIDAZOLE 500 MG TABS: 500 | 7 days supply | Qty: 14 | Fill #0

## 2020-04-14 ENCOUNTER — Ambulatory Visit: Payer: No Typology Code available for payment source | Admitting: Women's Health

## 2020-04-21 ENCOUNTER — Ambulatory Visit: Payer: No Typology Code available for payment source | Admitting: Women's Health

## 2020-04-26 MED FILL — Rivaroxaban Tab 20 MG: ORAL | 30 days supply | Qty: 30 | Fill #0 | Status: AC

## 2020-04-27 ENCOUNTER — Other Ambulatory Visit (HOSPITAL_COMMUNITY): Payer: Self-pay

## 2020-04-30 ENCOUNTER — Other Ambulatory Visit (HOSPITAL_COMMUNITY): Payer: Self-pay

## 2020-05-01 ENCOUNTER — Other Ambulatory Visit (HOSPITAL_COMMUNITY): Payer: Self-pay

## 2020-05-05 ENCOUNTER — Other Ambulatory Visit (HOSPITAL_COMMUNITY): Payer: Self-pay

## 2020-05-05 MED ORDER — PREDNISONE 10 MG PO TABS
ORAL_TABLET | ORAL | 0 refills | Status: DC
  Filled 2020-05-05: qty 45, 15d supply, fill #0

## 2020-05-05 MED ORDER — FUROSEMIDE 40 MG PO TABS
ORAL_TABLET | ORAL | 1 refills | Status: DC
  Filled 2020-05-05: qty 60, 60d supply, fill #0

## 2020-05-05 MED ORDER — METOPROLOL SUCCINATE ER 50 MG PO TB24
ORAL_TABLET | ORAL | 0 refills | Status: DC
  Filled 2020-05-05: qty 90, 90d supply, fill #0

## 2020-05-06 ENCOUNTER — Other Ambulatory Visit (HOSPITAL_COMMUNITY): Payer: Self-pay

## 2020-05-27 ENCOUNTER — Other Ambulatory Visit (HOSPITAL_COMMUNITY): Payer: Self-pay

## 2020-05-27 MED ORDER — RIVAROXABAN 20 MG PO TABS
ORAL_TABLET | ORAL | 5 refills | Status: DC
  Filled 2020-05-27: qty 30, 30d supply, fill #0

## 2020-06-01 ENCOUNTER — Other Ambulatory Visit (HOSPITAL_COMMUNITY): Payer: Self-pay

## 2020-06-11 ENCOUNTER — Ambulatory Visit (HOSPITAL_COMMUNITY)
Admission: RE | Admit: 2020-06-11 | Discharge: 2020-06-11 | Disposition: A | Discharge status: Home / Self Care | Payer: No Typology Code available for payment source | Source: Ambulatory Visit | Attending: Hematology | Admitting: Hematology

## 2020-06-11 ENCOUNTER — Other Ambulatory Visit: Payer: Self-pay

## 2020-06-11 ENCOUNTER — Inpatient Hospital Stay (HOSPITAL_COMMUNITY): Payer: No Typology Code available for payment source | Attending: Hematology

## 2020-06-11 DIAGNOSIS — I2699 Other pulmonary embolism without acute cor pulmonale: Secondary | ICD-10-CM | POA: Diagnosis not present

## 2020-06-11 LAB — D-DIMER, QUANTITATIVE: D-Dimer, Quant: <0.27 ug/mL-FEU (ref 0.00–0.50)

## 2020-06-11 MED ORDER — IOHEXOL 350 MG/ML SOLN
100.0000 mL | Freq: Once | INTRAVENOUS | Status: AC | PRN
  Administered 2020-06-11: 100 mL via INTRAVENOUS

## 2020-06-17 NOTE — Progress Notes (Signed)
[facility] [address], [place] [address]   CLINIC:  Medical Oncology/Hematology  PCP:  Elfredia Nevins, MD [address] / [place] [address]  [phone]  REASON FOR VISIT:  Follow-up for PE with pulmonary infarct  PRIOR THERAPY: Xarelto daily  CURRENT THERAPY: Surveillance  INTERVAL HISTORY:  Shirley Peters, a 30 y.o. female, returns for routine follow-up for her PE with pulmonary infarct. Shirley Peters was last seen on 02/19/2020.  Today she reports feeling good. She is no longer taking birth control pills; instead she has an IUD. She reports improvement with her palpitations.   REVIEW OF SYSTEMS:  Review of Systems  Constitutional:  Negative for appetite change and fatigue.  All other systems reviewed and are negative.  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  Other Topics Concern   Not on file  Social History Narrative   Son turned 4 on Feb 5         Social Determinants of Health   Financial Resource Strain: Low Risk    Difficulty of Paying Living Expenses: Not hard at all  Food Insecurity: No Food Insecurity   Worried  About Programme researcher, broadcasting/film/video in the Last Year: Never true   Barista in the Last Year: Never true  Transportation Needs: No Transportation Needs   Lack of Transportation (Medical): No   Lack of Transportation (Non-Medical): No  Physical Activity: Insufficiently Active   Days of Exercise per Week: 4 days   Minutes of Exercise per Session: 30 min  Stress: No Stress Concern Present   Feeling of Stress : Not at all  Social Connections: Moderately Isolated   Frequency of Communication with Friends and Family: More than three times a week   Frequency of Social Gatherings with Friends and Family: More than three times a week   Attends Religious Services: Never   Database administrator or Organizations: No   Attends Engineer, structural: Never   Marital Status: Living with partner  Intimate Partner Violence: Not At Risk   Fear of Current or Ex-Partner: No   Emotionally Abused: No   Physically Abused: No   Sexually Abused: No    FAMILY HISTORY:  Family History  Problem Relation Age of Onset   Pulmonary embolism Mother    Parkinson's disease Father    Diabetes Father    Cancer Father        prostate   Alcohol abuse Paternal  Grandfather    Breast cancer Paternal Grandmother    Diabetes Paternal Grandmother    Diabetes Maternal Grandmother    Kidney cancer Maternal Grandmother    Alcohol abuse Maternal Grandfather    Colon cancer Neg Hx     CURRENT MEDICATIONS:  Current Outpatient Medications  Medication Sig Dispense Refill   buPROPion (WELLBUTRIN XL) 150 MG 24 hr tablet Take 150 mg by mouth daily.     buPROPion (WELLBUTRIN XL) 150 MG 24 hr tablet TAKE 1 TABLET BY MOUTH ONCE A DAY 90 tablet 1   furosemide (LASIX) 40 MG tablet Take 1/2 to  1 tablet (40 mg) by mouth daily as needed 60 tablet 1   metoprolol succinate (TOPROL-XL) 50 MG 24 hr tablet Take 1 tablet (50 mg) by mouth once daily 90 tablet 0   metroNIDAZOLE (FLAGYL) 500 MG tablet TAKE 1 TABLET BY MOUTH 2 TIMES  DAILY 14 tablet 0   predniSONE (DELTASONE) 10 MG tablet Take 5 tablets daily for 3 days then 4 daily x3days then 3 daily x3days then 2 daily x3days then 1 daily x3days 45 tablet 0   rivaroxaban (XARELTO) 20 MG TABS tablet TAKE 1 TABLET BY MOUTH ONCE DAILY WITH THE EVENING MEAL 30 tablet 4   rivaroxaban (XARELTO) 20 MG TABS tablet take 1 tablet (20 mg) by oral route once daily with the evening meal 30 tablet 5   XARELTO 20 MG TABS tablet SMARTSIG:1 Tablet(s) By Mouth Every Evening     No current facility-administered medications for this visit.    ALLERGIES:  Allergies  Allergen Reactions   Penicillins     Patient doesn't know her reaction, states that she has been told she is allergic since she was young    PHYSICAL EXAM:  Performance status (ECOG): 1 - Symptomatic but completely ambulatory  There were no vitals filed for this visit. Wt Readings from Last 3 Encounters:  04/03/20 193 lb (87.5 kg)  03/17/20 196 lb 8 oz (89.1 kg)  03/13/20 195 lb (88.5 kg)   Physical Exam Vitals reviewed.  Constitutional:      Appearance: Normal appearance.  Cardiovascular:     Rate and Rhythm: Normal rate and regular rhythm.     Pulses: Normal pulses.     Heart sounds: Normal heart sounds.  Pulmonary:     Effort: Pulmonary effort is normal.     Breath sounds: Normal breath sounds.  Musculoskeletal:     Right lower leg: No edema.     Left lower leg: No edema.  Neurological:     General: No focal deficit present.     Mental Status: She is alert and oriented to person, place, and time.  Psychiatric:        Mood and Affect: Mood normal.        Behavior: Behavior normal.    LABORATORY DATA:  I have reviewed the labs as listed.  CBC Latest Ref Rng & Units 03/17/2020 12/04/2019 12/03/2019  WBC 4.0 - 10.5 K/uL - 12.4(H) 9.2  Hemoglobin 11 - 14.6 g/dL 38.8 82.8 00.3  Hematocrit 36.0 - 46.0 % - 39.5 38.3  Platelets 150 - 400 K/uL - 282 272   CMP Latest Ref Rng & Units 12/04/2019 12/02/2019  02/27/2012  Glucose 70 - 99 mg/dL 491(P) 90 -  BUN 6 - 20 mg/dL 8 9 -  Creatinine 9.15 - 1.00 mg/dL 0.56 9.79 -  Sodium 480 - 145 mmol/L 136 137 -  Potassium 3.5 - 5.1 mmol/L 4.1 3.7 -  Chloride 98 - 111 mmol/L 101 103 -  CO2 22 - 32 mmol/L 25 25 -  Calcium 8.9 - 10.3 mg/dL 1.6(X8.4(L) 0.9(U8.7(L) 9.6  Total Protein 6.5 - 8.1 g/dL - 7.3 7.3  Total Bilirubin 0.3 - 1.2 mg/dL - 0.4(V0.2(L) 0.3  Alkaline Phos 38 - 126 U/L - 58 74  AST 15 - 41 U/L - 15 10  ALT 0 - 44 U/L - 17 8      Component Value Date/Time   RBC 4.36 12/04/2019 0401   MCV 90.6 12/04/2019 0401   MCV 84.3 02/27/2012 0000   MCH 29.4 12/04/2019 0401   MCHC 32.4 12/04/2019 0401   RDW 12.6 12/04/2019 0401   RDW 13.6 02/27/2012 0000   LYMPHSABS 2.5 12/03/2019 0130   MONOABS 0.7 12/03/2019 0130   EOSABS 0.1 12/03/2019 0130   BASOSABS 0.0 12/03/2019 0130    DIAGNOSTIC IMAGING:  I have independently reviewed the scans and discussed with the patient. CT ANGIO CHEST PE W OR WO CONTRAST  Result Date: 06/11/2020 CLINICAL DATA:  Pulmonary embolism follow-up. EXAM: CT ANGIOGRAPHY CHEST WITH CONTRAST TECHNIQUE: Multidetector CT imaging of the chest was performed using the standard protocol during bolus administration of intravenous contrast. Multiplanar CT image reconstructions and MIPs were obtained to evaluate the vascular anatomy. CONTRAST:  100mL OMNIPAQUE IOHEXOL 350 MG/ML SOLN COMPARISON:  CT a chest dated December 02, 2019. FINDINGS: Cardiovascular: Satisfactory opacification of the pulmonary arteries to the segmental level. Right-sided pulmonary emboli have essentially resolved with residual webs noted in the distal interlobar pulmonary artery (series 4, image 49). Normal heart size. Trace pericardial fluid is unchanged sepsis. Mediastinum/Nodes: No enlarged mediastinal, hilar, or axillary lymph nodes. Thyroid gland, trachea, and esophagus demonstrate no significant findings. Lungs/Pleura: New pleuroparenchymal scarring in the right lower lobe.  Chronic scarring in the lingula. No focal consolidation, pleural effusion, or pneumothorax. Upper Abdomen: No acute abnormality. Musculoskeletal: No chest wall abnormality. No acute or significant osseous findings. Prior thoracolumbar fusion for scoliosis again noted. Review of the MIP images confirms the above findings. IMPRESSION: 1. Right-sided pulmonary emboli have essentially resolved with residual webs noted in the distal interlobar pulmonary artery. 2. New postinflammatory scarring in the right lower lobe. Electronically Signed   By: Obie DredgeWilliam T Derry M.D.   On: 06/11/2020 10:43     ASSESSMENT:  1.  Right-sided pulmonary embolism without cor pulmonale and pulmonary infarct: - Patient on progesterone only implant since 2012, switched to combination oral contraceptive pill norethindrone, ethynyl estradiol on 04/11/2019. - Presentation to the ER with shortness of breath, CT angio on 12/02/2019 showing marked amount of intraluminal low-attenuation in the right pulmonary artery extending into multiple upper lobe, middle lobe and lower lobe branches.  No evidence of saddle embolus.  No evidence of heart strain. - Doppler of the lower extremities on 12/03/2019 was negative for DVT. - No prior history of miscarriages/lupus/lupus anticoagulant. - Labs from 01/21/2020 shows lupus anticoagulant positive, while she was on Xarelto.  Anticardiolipin and beta-2 glycoprotein 1 antibodies were negative.  Factor V Leiden and prothrombin gene mutation testing was negative.   2.  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 -Mother died of pulmonary embolism when she was 8 months pregnant. - Father had prostate cancer.  Paternal grandmother had breast cancer and paternal aunt had kidney cancer.   PLAN:  1.  Weekly provoked pulmonary embolism: -  We reviewed D-dimer from 06/11/2020 which was undetectable.   CT angio chest from 06/11/2020 was negative for pulmonary embolism. - She may discontinue Xarelto at this time. - She has Mirena IUD. - Recommend repeating a lupus anticoagulant, anticardiolipin antibodies and antibeta-2 glycoprotein 1 antibodies in 4 to 6 weeks.  Follow-up 1 week after the blood work. - We discussed the likelihood of recurrence of thromboembolism is very low.  She was educated to get evaluated in the ER if there is any signs or symptoms of recurrence. - She will inform us should she plan to get pregnant.     Orders placed this encounter:  No orders of the defined types were placed in this encounter.    Doreatha Massed, MD [facility] [phone]   I, Alda Ponder, am acting as a scribe for Dr. Doreatha Massed.  I, Doreatha Massed MD, have reviewed the above documentation for accuracy and completeness, and I agree with the above.

## 2020-06-18 ENCOUNTER — Other Ambulatory Visit: Payer: Self-pay

## 2020-06-18 ENCOUNTER — Inpatient Hospital Stay (HOSPITAL_BASED_OUTPATIENT_CLINIC_OR_DEPARTMENT_OTHER): Payer: No Typology Code available for payment source | Admitting: Hematology

## 2020-06-18 VITALS — BP 136/88 | HR 90 | Resp 18 | Wt 198.2 lb

## 2020-06-18 DIAGNOSIS — Z7901 Long term (current) use of anticoagulants: Secondary | ICD-10-CM | POA: Diagnosis not present

## 2020-06-18 DIAGNOSIS — I2699 Other pulmonary embolism without acute cor pulmonale: Secondary | ICD-10-CM

## 2020-06-18 DIAGNOSIS — Z86711 Personal history of pulmonary embolism: Secondary | ICD-10-CM | POA: Insufficient documentation

## 2020-06-18 NOTE — Patient Instructions (Signed)
[facility] at [facility] Discharge Instructions  You were seen today by Dr. Ellin Saba. He went over your recent results. Dr. Ellin Saba will see you back in 5 - 7 weeks for labs and follow up.   Thank you for choosing [facility] at [facility] to provide your oncology and hematology care.  To afford each patient quality time with our provider, please arrive at least 15 minutes before your scheduled appointment time.   If you have a lab appointment with the Cancer Center please come in thru the Main Entrance and check in at the main information desk  You need to re-schedule your appointment should you arrive 10 or more minutes late.  We strive to give you quality time with our providers, and arriving late affects you and other patients whose appointments are after yours.  Also, if you no show three or more times for appointments you may be dismissed from the clinic at the providers discretion.     Again, thank you for choosing [facility].  Our hope is that these requests will decrease the amount of time that you wait before being seen by our physicians.       _____________________________________________________________  Should you have questions after your visit to [facility], please contact our office at [phone] between the hours of 8:00 a.m. and 4:30 p.m.  Voicemails left after 4:00 p.m. will not be returned until the following business day.  For prescription refill requests, have your pharmacy contact our office and allow 72 hours.    Cancer Center Support Programs:   > Cancer Support Group  2nd Tuesday of the month 1pm-2pm, Journey Room

## 2020-07-09 ENCOUNTER — Other Ambulatory Visit (HOSPITAL_COMMUNITY): Payer: Self-pay

## 2020-07-21 ENCOUNTER — Inpatient Hospital Stay (HOSPITAL_COMMUNITY): Payer: No Typology Code available for payment source | Attending: Hematology

## 2020-07-21 ENCOUNTER — Encounter (HOSPITAL_COMMUNITY): Payer: Self-pay

## 2020-07-21 ENCOUNTER — Other Ambulatory Visit (HOSPITAL_COMMUNITY)
Admission: RE | Admit: 2020-07-21 | Discharge: 2020-07-21 | Disposition: A | Discharge status: Home / Self Care | Payer: No Typology Code available for payment source | Source: Ambulatory Visit | Attending: Hematology | Admitting: Hematology

## 2020-07-21 DIAGNOSIS — Z975 Presence of (intrauterine) contraceptive device: Secondary | ICD-10-CM | POA: Insufficient documentation

## 2020-07-21 DIAGNOSIS — I2699 Other pulmonary embolism without acute cor pulmonale: Secondary | ICD-10-CM | POA: Insufficient documentation

## 2020-07-21 LAB — D-DIMER, QUANTITATIVE: D-Dimer, Quant: <0.27 ug/mL-FEU (ref 0.00–0.50)

## 2020-07-22 LAB — CARDIOLIPIN ANTIBODIES, IGG, IGM, IGA
Anticardiolipin IgA: <9 APL U/mL (ref 0–11)
Anticardiolipin IgG: <9 GPL U/mL (ref 0–14)
Anticardiolipin IgM: <9 MPL U/mL (ref 0–12)

## 2020-07-22 LAB — BETA-2-GLYCOPROTEIN I ABS, IGG/M/A
Beta-2 Glyco I IgG: <9 GPI IgG units (ref 0–20)
Beta-2-Glycoprotein I IgA: <9 GPI IgA units (ref 0–25)
Beta-2-Glycoprotein I IgM: <9 GPI IgM units (ref 0–32)

## 2020-07-23 LAB — LUPUS ANTICOAGULANT PANEL
DRVVT: 35.6 s (ref 0.0–47.0)
PTT Lupus Anticoagulant: 34.1 s (ref 0.0–51.9)

## 2020-07-28 ENCOUNTER — Ambulatory Visit (HOSPITAL_COMMUNITY): Payer: No Typology Code available for payment source | Admitting: Hematology and Oncology

## 2020-07-31 ENCOUNTER — Other Ambulatory Visit (HOSPITAL_COMMUNITY): Payer: Self-pay

## 2020-07-31 MED ORDER — CELECOXIB 200 MG PO CAPS
ORAL_CAPSULE | ORAL | 2 refills | Status: DC
  Filled 2020-07-31: qty 30, 30d supply, fill #0

## 2020-07-31 MED ORDER — CYCLOBENZAPRINE HCL 10 MG PO TABS
ORAL_TABLET | ORAL | 2 refills | Status: DC
  Filled 2020-07-31: qty 60, 20d supply, fill #0

## 2020-08-03 NOTE — Progress Notes (Signed)
[facility] [address], [place] [address]   CLINIC:  Medical Oncology/Hematology  PCP:  Elfredia Nevins, MD [address] [place] [address] [phone]   REASON FOR VISIT:  Follow-up for right-sided pulmonary embolism  PRIOR THERAPY: Xarelto  CURRENT THERAPY: None  INTERVAL HISTORY:  Ms. Shirley Peters 30 y.o. female returns for routine follow-up of her right-sided pulmonary embolism.  She was last seen by Dr. Ellin Saba on 06/18/2020.  At today's visit, she reports feeling fairly well.  No recent hospitalizations, surgeries, or changes in baseline health status.  She denies any unilateral leg swelling, pain, and erythema.  No shortness of breath, dyspnea on exertion, chest pain, cough, hemoptysis, or palpitations. She completed her course of Xarelto, and has not had any issues after she stopped taking it. She is no longer on oral contraceptive pills, but has had an IUD implanted instead.  Her only complaint is that she has had a persistent headache for the past 2 months.  She is being worked up by her PCP as well as her dentist, as there is some concern that this may be related to her TMJ.  Her PCP is considering doing a brain scan and referring her to neurology if it does not improved after she starts wearing her mouthguard.  She has 100% energy and 100% appetite. She endorses that she is maintaining a stable weight.    REVIEW OF SYSTEMS:  Review of Systems  Constitutional:  Negative for appetite change, chills, diaphoresis, fatigue, fever and unexpected weight change.  HENT:   Negative for lump/mass and nosebleeds.   Eyes:  Negative for eye problems.  Respiratory:  Negative for cough, hemoptysis and shortness of breath.   Cardiovascular:  Negative for chest pain, leg swelling and palpitations.  Gastrointestinal:  Negative for abdominal pain, blood in stool, constipation, diarrhea, nausea and vomiting.  Genitourinary:  Negative for hematuria.    Skin: Negative.   Neurological:  Positive for headaches. Negative for dizziness and light-headedness.  Hematological:  Does not bruise/bleed easily.     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  Other Topics Concern   Not on file  Social History Narrative   Son turned 4 on Feb 5         Social Determinants of Health   Financial Resource Strain: Low Risk    Difficulty of Paying Living Expenses: Not hard at all  Food Insecurity: No Food Insecurity   Worried About Programme researcher, broadcasting/film/video in  the Last Year: Never true   Ran Out of Food in the Last Year: Never true  Transportation Needs: No Transportation Needs   Lack of Transportation (Medical): No   Lack of Transportation (Non-Medical): No  Physical Activity: Insufficiently Active   Days of Exercise per Week: 4 days   Minutes of Exercise per Session: 30 min  Stress: No Stress Concern Present   Feeling of Stress : Not at all  Social Connections: Moderately Isolated   Frequency of Communication with Friends and Family: More than three times a week   Frequency of Social Gatherings with Friends and Family: More than three times a week    Attends Religious Services: Never   Database administrator or Organizations: No   Attends Engineer, structural: Never   Marital Status: Living with partner  Catering manager Violence: Not At Risk   Fear of Current or Ex-Partner: No   Emotionally Abused: No   Physically Abused: No   Sexually Abused: No    FAMILY HISTORY:  Family History  Problem Relation Age of Onset   Pulmonary embolism Mother    Parkinson's disease Father    Diabetes Father    Cancer Father        prostate   Alcohol abuse Paternal Grandfather    Breast cancer Paternal Grandmother    Diabetes Paternal Grandmother    Diabetes Maternal Grandmother    Kidney cancer Maternal Grandmother    Alcohol abuse Maternal Grandfather    Colon cancer Neg Hx     CURRENT MEDICATIONS:  Outpatient Encounter Medications as of 08/04/2020  Medication Sig   celecoxib (CELEBREX) 200 MG capsule Take 1 capsule by mouth daily as needed   cyclobenzaprine (FLEXERIL) 10 MG tablet Take 1 tablet by mouth every 8 hours as needed   furosemide (LASIX) 40 MG tablet Take 1/2 to  1 tablet (40 mg) by mouth daily as needed   metoprolol succinate (TOPROL-XL) 50 MG 24 hr tablet Take 1 tablet (50 mg) by mouth once daily   [DISCONTINUED] norethindrone-ethinyl estradiol (JUNEL FE 1/20) 1-20 MG-MCG tablet Take 1 tablet by mouth daily.   No facility-administered encounter medications on file as of 08/04/2020.    ALLERGIES:  Allergies  Allergen Reactions   Penicillins     Patient doesn't know her reaction, states that she has been told she is allergic since she was young     PHYSICAL EXAM:  ECOG PERFORMANCE STATUS: 0 - Asymptomatic  There were no vitals filed for this visit. There were no vitals filed for this visit. Physical Exam Constitutional:      Appearance: Normal appearance. She is obese.  HENT:     Head: Normocephalic and atraumatic.     Mouth/Throat:     Mouth: Mucous membranes are moist.  Eyes:     Extraocular  Movements: Extraocular movements intact.     Pupils: Pupils are equal, round, and reactive to light.  Cardiovascular:     Rate and Rhythm: Normal rate and regular rhythm.     Pulses: Normal pulses.     Heart sounds: Normal heart sounds.  Pulmonary:     Effort: Pulmonary effort is normal.     Breath sounds: Normal breath sounds.  Abdominal:     General: Bowel sounds are normal.     Palpations: Abdomen is soft.     Tenderness: There is no abdominal tenderness.  Musculoskeletal:        General: No swelling.     Right lower  leg: Edema (trace) present.     Left lower leg: Edema (trace) present.  Lymphadenopathy:     Cervical: No cervical adenopathy.  Skin:    General: Skin is warm and dry.  Neurological:     General: No focal deficit present.     Mental Status: She is alert and oriented to person, place, and time.  Psychiatric:        Mood and Affect: Mood normal.        Behavior: Behavior normal.     LABORATORY DATA:  I have reviewed the labs as listed.  CBC    Component Value Date/Time   WBC 12.4 (H) 12/04/2019 0401   RBC 4.36 12/04/2019 0401   HGB 12.3 03/17/2020 1037   HGB 12.8 12/04/2019 0401   HCT 39.5 12/04/2019 0401   HCT 39 02/27/2012 0000   PLT 282 12/04/2019 0401   MCV 90.6 12/04/2019 0401   MCV 84.3 02/27/2012 0000   MCH 29.4 12/04/2019 0401   MCHC 32.4 12/04/2019 0401   RDW 12.6 12/04/2019 0401   RDW 13.6 02/27/2012 0000   LYMPHSABS 2.5 12/03/2019 0130   MONOABS 0.7 12/03/2019 0130   EOSABS 0.1 12/03/2019 0130   BASOSABS 0.0 12/03/2019 0130   CMP Latest Ref Rng & Units 12/04/2019 12/02/2019 02/27/2012  Glucose 70 - 99 mg/dL 409(W117(H) 90 -  BUN 6 - 20 mg/dL 8 9 -  Creatinine 1.190.44 - 1.00 mg/dL 1.470.54 8.290.47 -  Sodium 562135 - 145 mmol/L 136 137 -  Potassium 3.5 - 5.1 mmol/L 4.1 3.7 -  Chloride 98 - 111 mmol/L 101 103 -  CO2 22 - 32 mmol/L 25 25 -  Calcium 8.9 - 10.3 mg/dL 1.3(Y8.4(L) 8.6(V8.7(L) 9.6  Total Protein 6.5 - 8.1 g/dL - 7.3 7.3  Total Bilirubin 0.3 - 1.2  mg/dL - 7.8(I0.2(L) 0.3  Alkaline Phos 38 - 126 U/L - 58 74  AST 15 - 41 U/L - 15 10  ALT 0 - 44 U/L - 17 8    DIAGNOSTIC IMAGING:  I have independently reviewed the relevant imaging and discussed with the patient.  ASSESSMENT & PLAN: 1.  Right-sided pulmonary embolism without cor pulmonale and pulmonary infarct: - Patient on progesterone only implant since 2012, switched to combination oral contraceptive pill norethindrone, ethynyl estradiol on 04/11/2019. - Presentation to the ER with shortness of breath, CT angio on 12/02/2019 showing marked amount of intraluminal low-attenuation in the right pulmonary artery extending into multiple upper lobe, middle lobe and lower lobe branches.  No evidence of saddle embolus.  No evidence of heart strain. - Doppler of the lower extremities on 12/03/2019 was negative for DVT. - No prior history of miscarriages/lupus/lupus anticoagulant. - Family history positive for patient's mother deceased secondary to pulmonary embolism when she was 8 months pregnant - Labs from 01/21/2020 shows lupus anticoagulant positive, while she was on Xarelto.  Anticardiolipin and beta-2 glycoprotein 1 antibodies were negative.  Factor V Leiden and prothrombin gene mutation testing was negative. - CT angio chest from 06/11/2020 was negative for pulmonary embolism; D-dimer from 06/11/2020 was undetectable - Xarelto was discontinued at appointment on 06/18/2020 - Lupus anticoagulant, anticardiolipin antibodies, and antibeta-2 glycoprotein 1 antibodies were repeated after completion of Xarelto - all were NEGATIVE - Patient has Mirena IUD, is no longer on oral contraceptives. - PLAN: No indication for continued anticoagulation at this time.  We discussed that the likelihood of recurrence of VTE is very low.  She was educated to get evaluated in the ER  if there are any signs or symptoms of recurrence.  She will inform us should she plan to get pregnant in the future.  She can follow-up as needed  with hematology clinic, no indication for scheduling regular visits at this time.  2.  Social/family history: - She works as a Agricultural engineer at [employer].  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 -Mother died of pulmonary embolism when she was 8 months pregnant. - Father had prostate cancer.  Paternal grandmother had breast cancer and paternal aunt had kidney cancer.   PLAN SUMMARY & DISPOSITION: -Follow-up as needed  All questions were answered. The patient knows to call the clinic with any problems, questions or concerns.  Medical decision making: Low  Time spent on visit: I spent 15 minutes counseling the patient face to face. The total time spent in the appointment was 25 minutes and more than 50% was on counseling.   Carnella Guadalajara, PA-C  08/04/2020 11:27 AM

## 2020-08-04 ENCOUNTER — Encounter (HOSPITAL_COMMUNITY): Payer: Self-pay | Admitting: Physician Assistant

## 2020-08-04 ENCOUNTER — Other Ambulatory Visit: Payer: Self-pay

## 2020-08-04 ENCOUNTER — Inpatient Hospital Stay (HOSPITAL_BASED_OUTPATIENT_CLINIC_OR_DEPARTMENT_OTHER): Payer: No Typology Code available for payment source | Admitting: Physician Assistant

## 2020-08-04 VITALS — BP 127/83 | HR 95 | Temp 98.1°F | Resp 20 | Wt 198.2 lb

## 2020-08-04 DIAGNOSIS — I2699 Other pulmonary embolism without acute cor pulmonale: Secondary | ICD-10-CM

## 2020-08-04 DIAGNOSIS — Z975 Presence of (intrauterine) contraceptive device: Secondary | ICD-10-CM | POA: Diagnosis not present

## 2020-08-04 NOTE — Patient Instructions (Signed)
[facility] at [facility] Discharge Instructions  You were seen today by Rojelio Brenner PA-C for your history of pulmonary embolism (blood clot in the lung).  We tested your blood to see if you have any sort of blood condition that would predispose you to blood clots.  These tests did NOT show any increased risk of blood clots in your blood (I.e. no hypercoagulable state).  There is no need for you to continue anticoagulation at this time.  However, if you are pregnant in the future orare planning to become pregnant, we would want you to call our office and set up an appointment so that we could go over the risks of blood clots and consider medications to help prevent blood clots during pregnancy.  You do not need to continue to follow-up with our office, unless you have any blood clots in the future.  If you have any blood clots in your legs or your lungs at any point in your life, you may need to be on lifelong anticoagulation.  If that happens, please call our office and schedule an appointment as soon as possible.  _____________________________________________________________  Thank you for choosing [facility] at [facility] to provide your oncology and hematology care.  To afford each patient quality time with our provider, please arrive at least 15 minutes before your scheduled appointment time.   If you have a lab appointment with the Cancer Center please come in thru the Main Entrance and check in at the main information desk.  You need to re-schedule your appointment should you arrive 10 or more minutes late.  We strive to give you quality time with our providers, and arriving late affects you and other patients whose appointments are after yours.  Also, if you no show three or more times for appointments you may be dismissed from the clinic at the providers discretion.     Again, thank you for choosing [facility].  Our hope is  that these requests will decrease the amount of time that you wait before being seen by our physicians.       _____________________________________________________________  Should you have questions after your visit to [facility], please contact our office at [phone] and follow the prompts.  Our office hours are 8:00 a.m. and 4:30 p.m. Monday - Friday.  Please note that voicemails left after 4:00 p.m. may not be returned until the following business day.  We are closed weekends and major holidays.  You do have access to a nurse 24-7, just call the main number to the clinic [phone] and do not press any options, hold on the line and a nurse will answer the phone.    For prescription refill requests, have your pharmacy contact our office and allow 72 hours.    Due to Covid, you will need to wear a mask upon entering the hospital. If you do not have a mask, a mask will be given to you at the Main Entrance upon arrival. For doctor visits, patients may have 1 support person age 67 or older with them. For treatment visits, patients can not have anyone with them due to social distancing guidelines and our immunocompromised population.

## 2020-08-05 ENCOUNTER — Other Ambulatory Visit (HOSPITAL_COMMUNITY): Payer: Self-pay

## 2020-08-05 MED ORDER — METOPROLOL SUCCINATE ER 50 MG PO TB24
ORAL_TABLET | ORAL | 0 refills | Status: DC
  Filled 2020-08-05: qty 90, 90d supply, fill #0

## 2020-08-05 MED ORDER — CLINDAMYCIN HCL 300 MG PO CAPS
ORAL_CAPSULE | ORAL | 0 refills | Status: DC
  Filled 2020-08-05: qty 21, 7d supply, fill #0

## 2020-09-22 ENCOUNTER — Other Ambulatory Visit (HOSPITAL_COMMUNITY): Payer: Self-pay

## 2020-09-22 MED ORDER — ESCITALOPRAM OXALATE 10 MG PO TABS
ORAL_TABLET | ORAL | 1 refills | Status: DC
  Filled 2020-09-22: qty 90, 90d supply, fill #0
  Filled 2020-12-20: qty 90, 90d supply, fill #1

## 2020-09-22 MED ORDER — ZOLPIDEM TARTRATE 10 MG PO TABS
ORAL_TABLET | ORAL | 2 refills | Status: DC
  Filled 2020-09-22: qty 30, 30d supply, fill #0
  Filled 2020-11-03: qty 30, 30d supply, fill #1
  Filled 2020-12-07: qty 30, 30d supply, fill #2

## 2020-11-03 ENCOUNTER — Other Ambulatory Visit (HOSPITAL_COMMUNITY): Payer: Self-pay

## 2020-11-05 ENCOUNTER — Other Ambulatory Visit (HOSPITAL_COMMUNITY): Payer: Self-pay

## 2020-11-05 MED ORDER — METOPROLOL SUCCINATE ER 50 MG PO TB24
ORAL_TABLET | ORAL | 1 refills | Status: DC
  Filled 2020-11-05: qty 90, 90d supply, fill #0
  Filled 2021-02-09: qty 90, 90d supply, fill #1

## 2020-11-27 ENCOUNTER — Ambulatory Visit
Admission: RE | Admit: 2020-11-27 | Discharge: 2020-11-27 | Disposition: A | Discharge status: Home / Self Care | Payer: No Typology Code available for payment source | Source: Ambulatory Visit

## 2020-11-27 ENCOUNTER — Other Ambulatory Visit: Payer: Self-pay

## 2020-11-27 VITALS — BP 131/81 | HR 76 | Temp 98.7°F | Resp 18

## 2020-11-27 DIAGNOSIS — Z1152 Encounter for screening for COVID-19: Secondary | ICD-10-CM | POA: Diagnosis not present

## 2020-11-27 DIAGNOSIS — B349 Viral infection, unspecified: Secondary | ICD-10-CM

## 2020-11-27 LAB — POCT RAPID STREP A (OFFICE): Rapid Strep A Screen: NEGATIVE

## 2020-11-27 NOTE — ED Triage Notes (Signed)
Pt c/o ST, runny nose, and HA x 4 days

## 2020-11-27 NOTE — ED Provider Notes (Signed)
[facility]    CSN: [id number] Arrival date & time: 11/27/20  0849      History   Chief Complaint Chief Complaint  Patient presents with   Sore Throat   Headache    HPI Shirley Peters is a 30 y.o. female.  Patient presents with 4-day history of headache, congestion, runny nose, sore throat.  No fever, rash, cough, shortness of breath, chest pain, or other symptoms.  Treatment at home with Tylenol cold medication.  Her medical history includes pulmonary embolism with pulmonary infarct in 2021.  The history is provided by the patient and medical records.   Past Medical History:  Diagnosis Date   Pulmonary embolus [facility])    November 2021   Scoliosis     Patient Active Problem List   Diagnosis Date Noted   Encounter for IUD insertion 03/17/2020   Pulmonary embolus ([facility]) 12/02/2019    Past Surgical History:  Procedure Laterality Date   BACK SURGERY     age 72   WISDOM TOOTH EXTRACTION  Jan 2014    OB History     Gravida  1   Para  1   Term  1   Preterm      AB      Living  1      SAB      IAB      Ectopic      Multiple      Live Births  1            Home Medications    Prior to Admission medications   Medication Sig Start Date End Date Taking? Authorizing Provider  escitalopram (LEXAPRO) 10 MG tablet Take 1 tablet (10 mg) by mouth once daily in the morning. 09/22/20  Yes   levonorgestrel (MIRENA) 20 MCG/DAY IUD 1 each by Intrauterine route once.   Yes [provider]  metoprolol succinate (TOPROL-XL) 50 MG 24 hr tablet Take 1 tablet (50 mg) by mouth once daily 11/05/20  Yes   zolpidem (AMBIEN) 10 MG tablet Take 1 tablet (10 mg) by mouth once daily at bedtime as needed. 09/22/20  Yes   celecoxib (CELEBREX) 200 MG capsule Take 1 capsule by mouth daily as needed 07/31/20     clindamycin (CLEOCIN) 300 MG capsule Take 1 capsule by mouth 3 times per day (every 8 hours). 08/05/20     cyclobenzaprine (FLEXERIL) 10 MG tablet Take 1  tablet by mouth every 8 hours as needed 07/31/20     furosemide (LASIX) 40 MG tablet Take 1/2 to  1 tablet (40 mg) by mouth daily as needed 05/05/20     norethindrone-ethinyl estradiol (JUNEL FE 1/20) 1-20 MG-MCG tablet Take 1 tablet by mouth daily. 07/11/19 12/04/19  Estill Dooms, NP    Family History Family History  Problem Relation Age of Onset   Pulmonary embolism Mother    Parkinson's disease Father    Diabetes Father    Cancer Father        prostate   Alcohol abuse Paternal Grandfather    Breast cancer Paternal Grandmother    Diabetes Paternal Grandmother    Diabetes Maternal Grandmother    Kidney cancer Maternal Grandmother    Alcohol abuse Maternal Grandfather    Colon cancer Neg Hx     Social History Social History   Tobacco Use   Smoking status: Former    Types: E-cigarettes    Quit date: 11/11/2019    Years since quitting: 1.0  Smokeless tobacco: Never  Vaping Use   Vaping Use: Former  Substance Use Topics   Alcohol use: Not Currently    Comment: occ   Drug use: No     Allergies   Penicillins   Review of Systems Review of Systems  Constitutional:  Negative for chills and fever.  HENT:  Positive for congestion, rhinorrhea and sore throat. Negative for ear pain.   Respiratory:  Negative for cough and shortness of breath.   Cardiovascular:  Negative for chest pain and palpitations.  Gastrointestinal:  Negative for diarrhea and vomiting.  Skin:  Negative for color change and rash.  Neurological:  Positive for headaches. Negative for dizziness.  All other systems reviewed and are negative.   Physical Exam Triage Vital Signs ED Triage Vitals  Enc Vitals Group     BP      Pulse      Resp      Temp      Temp src      SpO2      Weight      Height      Head Circumference      Peak Flow      Pain Score      Pain Loc      Pain Edu?      Excl. in GC?    No data found.  Updated Vital Signs BP 131/81 (BP Location: Left Arm)   Pulse 76    Temp 98.7 F (37.1 C) (Oral)   Resp 18   SpO2 95%   Visual Acuity Right Eye Distance:   Left Eye Distance:   Bilateral Distance:    Right Eye Near:   Left Eye Near:    Bilateral Near:     Physical Exam Vitals and nursing note reviewed.  Constitutional:      General: She is not in acute distress.    Appearance: She is well-developed.  HENT:     Head: Normocephalic and atraumatic.     Right Ear: Tympanic membrane normal.     Left Ear: Tympanic membrane normal.     Nose: Nose normal.     Mouth/Throat:     Mouth: Mucous membranes are moist.     Pharynx: Oropharynx is clear.  Eyes:     Conjunctiva/sclera: Conjunctivae normal.  Cardiovascular:     Rate and Rhythm: Normal rate and regular rhythm.     Heart sounds: Normal heart sounds.  Pulmonary:     Effort: Pulmonary effort is normal. No respiratory distress.     Breath sounds: Normal breath sounds.  Abdominal:     Palpations: Abdomen is soft.     Tenderness: There is no abdominal tenderness.  Musculoskeletal:     Cervical back: Neck supple.  Skin:    General: Skin is warm and dry.  Neurological:     Mental Status: She is alert.  Psychiatric:        Mood and Affect: Mood normal.        Behavior: Behavior normal.     UC Treatments / Results  Labs (all labs ordered are listed, but only abnormal results are displayed) Labs Reviewed  COVID-19, FLU A+B NAA  POCT RAPID STREP A (OFFICE)    EKG   Radiology No results found.  Procedures Procedures (including critical care time)  Medications Ordered in UC Medications - No data to display  Initial Impression / Assessment and Plan / UC Course  I have reviewed the triage vital signs and the  nursing notes.  Pertinent labs & imaging results that were available during my care of the patient were reviewed by me and considered in my medical decision making (see chart for details).  Viral illness.  Rapid strep negative.  COVID and Flu pending.  Instructed patient to  self quarantine per CDC guidelines.  Discussed symptomatic treatment including Tylenol or ibuprofen, rest, hydration.  Instructed patient to follow up with PCP if symptoms are not improving.  Patient agrees to plan of care.    Final Clinical Impressions(s) / UC Diagnoses   Final diagnoses:  Encounter for screening for COVID-19  Viral illness     Discharge Instructions      Your strep test is negative.  Your COVID and flu tests are pending.  Take Tylenol or ibuprofen as needed for fever or discomfort.  Rest and keep yourself hydrated.    Follow-up with your primary care provider if your symptoms are not improving.         ED Prescriptions   None    PDMP not reviewed this encounter.   Sharion Balloon, NP 11/27/20 [phone]

## 2020-11-27 NOTE — Discharge Instructions (Addendum)
Your strep test is negative.  Your COVID and flu tests are pending.  Take Tylenol or ibuprofen as needed for fever or discomfort.  Rest and keep yourself hydrated.    Follow-up with your primary care provider if your symptoms are not improving.

## 2020-11-28 LAB — COVID-19, FLU A+B NAA
Influenza A, NAA: NOT DETECTED
Influenza B, NAA: NOT DETECTED
SARS-CoV-2, NAA: NOT DETECTED

## 2020-12-08 ENCOUNTER — Other Ambulatory Visit (HOSPITAL_COMMUNITY): Payer: Self-pay

## 2020-12-21 ENCOUNTER — Other Ambulatory Visit (HOSPITAL_COMMUNITY): Payer: Self-pay

## 2021-01-08 ENCOUNTER — Other Ambulatory Visit (HOSPITAL_COMMUNITY): Payer: Self-pay

## 2021-01-08 MED ORDER — ZOLPIDEM TARTRATE 10 MG PO TABS
10.0000 mg | ORAL_TABLET | Freq: Every day | ORAL | 0 refills | Status: DC
  Filled 2021-01-08: qty 90, 90d supply, fill #0

## 2021-02-09 ENCOUNTER — Other Ambulatory Visit (HOSPITAL_COMMUNITY): Payer: Self-pay

## 2021-03-20 ENCOUNTER — Other Ambulatory Visit (HOSPITAL_COMMUNITY): Payer: Self-pay

## 2021-03-21 ENCOUNTER — Other Ambulatory Visit (HOSPITAL_COMMUNITY): Payer: Self-pay

## 2021-03-21 MED ORDER — ESCITALOPRAM OXALATE 10 MG PO TABS
ORAL_TABLET | ORAL | 1 refills | Status: DC
  Filled 2021-03-21: qty 90, 90d supply, fill #0
  Filled 2021-06-29: qty 90, 90d supply, fill #1

## 2021-03-22 ENCOUNTER — Encounter: Payer: Self-pay | Admitting: Women's Health

## 2021-03-22 ENCOUNTER — Ambulatory Visit (INDEPENDENT_AMBULATORY_CARE_PROVIDER_SITE_OTHER): Payer: No Typology Code available for payment source | Admitting: Women's Health

## 2021-03-22 ENCOUNTER — Other Ambulatory Visit: Payer: Self-pay

## 2021-03-22 ENCOUNTER — Other Ambulatory Visit (HOSPITAL_COMMUNITY): Payer: Self-pay

## 2021-03-22 VITALS — BP 113/79 | HR 75 | Ht 67.0 in | Wt 185.4 lb

## 2021-03-22 DIAGNOSIS — Z01419 Encounter for gynecological examination (general) (routine) without abnormal findings: Secondary | ICD-10-CM | POA: Diagnosis not present

## 2021-03-22 DIAGNOSIS — Z30432 Encounter for removal of intrauterine contraceptive device: Secondary | ICD-10-CM

## 2021-03-22 NOTE — Progress Notes (Signed)
? ?WELL-WOMAN EXAMINATION ?Patient name: Shirley Peters MRN [id number]  Date of birth: 1990-10-23 ?Chief Complaint:   ?Annual Exam ? ?History of Present Illness:   ?Shirley Peters is a 31 y.o. G42P1001 Caucasian female being seen today for a routine well-woman exam.  ?Current complaints: wants IUD removed, wants to get pregnant. Had PE 12/02/19, was on COCs and vaping. Labs post xarelto were normal. Mom died @ pregnant w/ PE.  ? ?PCP: [facility]      ?does not desire labs ?Patient's last menstrual period was 02/24/2021 (approximate). ?The current method of family planning is IUD, Mirena 03/17/20 ?Last pap 04/10/19. Results were: NILM w/ HRHPV negative. H/O abnormal pap: no ?Last mammogram: never. Results were: N/A. Family h/o breast cancer: yes PGM ?Last colonoscopy: never. Results were: N/A. Family h/o colorectal cancer: no ? ?Depression screen [facility] 2/9 03/22/2021 01/21/2020 04/10/2019  ?Decreased Interest 0 0 0  ?Down, Depressed, Hopeless 0 0 0  ?PHQ - 2 Score 0 0 0  ?Altered sleeping 1 - 1  ?Tired, decreased energy 1 - 1  ?Change in appetite 0 - 0  ?Feeling bad or failure about yourself  0 - 0  ?Trouble concentrating 0 - 0  ?Moving slowly or fidgety/restless 0 - 0  ?Suicidal thoughts 0 - 0  ?PHQ-9 Score 2 - 2  ?Difficult doing work/chores - - Not difficult at all  ? ?  ?GAD 7 : Generalized Anxiety Score 03/22/2021 04/10/2019  ?Nervous, Anxious, on Edge 0 0  ?Control/stop worrying 0 0  ?Worry too much - different things 1 0  ?Trouble relaxing 1 0  ?Restless 0 0  ?Easily annoyed or irritable 1 1  ?Afraid - awful might happen 0 0  ?Total GAD 7 Score 3 1  ?Anxiety Difficulty - Not difficult at all  ? ? ? ?Review of Systems:   ?Pertinent items are noted in HPI ?Denies any headaches, blurred vision, fatigue, shortness of breath, chest pain, abdominal pain, abnormal vaginal discharge/itching/odor/irritation, problems with periods, bowel movements, urination, or intercourse unless otherwise stated above. ?Pertinent History Reviewed:   ?Reviewed past medical,surgical, social and family history.  ?Reviewed problem list, medications and allergies. ?Physical Assessment:  ? ?Vitals:  ? 03/22/21 1456  ?BP: 113/79  ?Pulse: 75  ?Weight: 185 lb 6.4 oz (84.1 kg)  ?Height: 5\' 7"  (1.702 m)  ?Body mass index is 29.04 kg/m?. ?  ?     Physical Examination:  ? General appearance - well appearing, and in no distress ? Mental status - alert, oriented to person, place, and time ? Psych:  She has a normal mood and affect ? Skin - warm and dry, normal color, no suspicious lesions noted ? Chest - effort normal, all lung fields clear to auscultation bilaterally ? Heart - normal rate and regular rhythm ? Neck:  midline trachea, no thyromegaly or nodules ? Breasts - breasts appear normal, no suspicious masses, no skin or nipple changes or  axillary nodes ? Abdomen - soft, nontender, nondistended, no masses or organomegaly ? Pelvic - VULVA: normal appearing vulva with no masses, tenderness or lesions  VAGINA: normal appearing vagina with normal color and discharge, no lesions  CERVIX: normal appearing cervix without discharge or lesions, no CMT ? Thin prep pap is not done  ? UTERUS: uterus is felt to be normal size, shape, consistency and nontender  ? ADNEXA: No adnexal masses or tenderness noted. ? Extremities:  No swelling or varicosities noted ? ?IUD REMOVAL ?Time out was performed. ? ?  A graves speculum was placed in the vagina.  The cervix was visualized, and the strings were visible. They were grasped and the Mirena  IUD was easily removed intact without complications. The patient tolerated the procedure well.  ? ?Chaperone: Angel Neas   ? ?No results found for this or any previous visit (from the past 24 hour(s)).  ?Assessment & Plan:  ?1) Well-Woman Exam ? ?2) IUD removal> wants to get pregnant, continue pnv, let us know when gets +HPT ? ?3) H/O PE> provoked, was on COCs and vaping, labs post xarelto were normal, mom died @ pregnant w/ PE, discussed w/ [facility],  will start lovenox w/ future pregnancy ? ?Labs/procedures today: IUD removal ? ?Mammogram: @ 31yo, or sooner if problems ?Colonoscopy: @ 31yo, or sooner if problems ? ?No orders of the defined types were placed in this encounter. ? ? ?Meds: No orders of the defined types were placed in this encounter. ? ? ?Follow-up: Return in about 1 year (around 03/23/2022) for Physical. ? ?Cheral Marker CNM, WHNP-BC ?03/22/2021 ?3:37 PM  ?

## 2021-04-12 ENCOUNTER — Other Ambulatory Visit (HOSPITAL_COMMUNITY): Payer: Self-pay

## 2021-04-13 ENCOUNTER — Other Ambulatory Visit (HOSPITAL_COMMUNITY): Payer: Self-pay

## 2021-04-14 ENCOUNTER — Other Ambulatory Visit (HOSPITAL_COMMUNITY): Payer: Self-pay

## 2021-05-10 ENCOUNTER — Other Ambulatory Visit (HOSPITAL_COMMUNITY): Payer: Self-pay

## 2021-05-10 MED ORDER — METOPROLOL SUCCINATE ER 50 MG PO TB24
ORAL_TABLET | ORAL | 1 refills | Status: DC
  Filled 2021-05-10: qty 90, 90d supply, fill #0
  Filled 2021-08-08: qty 90, 90d supply, fill #1

## 2021-06-29 ENCOUNTER — Other Ambulatory Visit (HOSPITAL_COMMUNITY): Payer: Self-pay

## 2021-07-08 ENCOUNTER — Encounter: Payer: Self-pay | Admitting: *Deleted

## 2021-07-08 ENCOUNTER — Ambulatory Visit (INDEPENDENT_AMBULATORY_CARE_PROVIDER_SITE_OTHER): Payer: No Typology Code available for payment source | Admitting: *Deleted

## 2021-07-08 VITALS — BP 115/72 | HR 84 | Ht 67.0 in | Wt 180.0 lb

## 2021-07-08 DIAGNOSIS — Z3201 Encounter for pregnancy test, result positive: Secondary | ICD-10-CM

## 2021-07-08 LAB — POCT URINE PREGNANCY: Preg Test, Ur: POSITIVE — AB

## 2021-07-08 NOTE — Progress Notes (Signed)
   NURSE VISIT- PREGNANCY CONFIRMATION   SUBJECTIVE:  Shirley Peters is a 31 y.o. G5P1001 female at [redacted]w[redacted]d by certain LMP of Patient's last menstrual period was 06/11/2021. Here for pregnancy confirmation.  Home pregnancy test: positive x 3   She reports cramping.  She is taking prenatal vitamins.    OBJECTIVE:  BP 115/72 (BP Location: Left Arm, Patient Position: Sitting, Cuff Size: Normal)   Pulse 84   LMP 06/11/2021   Appears well, in no apparent distress  Results for orders placed or performed in visit on 07/08/21 (from the past 24 hour(s))  POCT urine pregnancy   Collection Time: 07/08/21 10:40 AM  Result Value Ref Range   Preg Test, Ur Positive (A) Negative    ASSESSMENT: Positive pregnancy test, [redacted]w[redacted]d by LMP    PLAN: Schedule for dating ultrasound in 5 weeks Prenatal vitamins: continue   Nausea medicines: not currently needed   OB packet given: Yes  Annamarie Dawley  07/08/2021 10:42 AM

## 2021-08-09 ENCOUNTER — Other Ambulatory Visit (HOSPITAL_COMMUNITY): Payer: Self-pay

## 2021-08-10 ENCOUNTER — Other Ambulatory Visit: Payer: Self-pay | Admitting: Obstetrics & Gynecology

## 2021-08-10 DIAGNOSIS — O3680X0 Pregnancy with inconclusive fetal viability, not applicable or unspecified: Secondary | ICD-10-CM

## 2021-08-11 ENCOUNTER — Telehealth: Payer: Self-pay | Admitting: *Deleted

## 2021-08-11 ENCOUNTER — Ambulatory Visit (INDEPENDENT_AMBULATORY_CARE_PROVIDER_SITE_OTHER): Payer: No Typology Code available for payment source

## 2021-08-11 DIAGNOSIS — O3680X0 Pregnancy with inconclusive fetal viability, not applicable or unspecified: Secondary | ICD-10-CM | POA: Diagnosis not present

## 2021-08-11 DIAGNOSIS — Z3A08 8 weeks gestation of pregnancy: Secondary | ICD-10-CM | POA: Diagnosis not present

## 2021-08-11 NOTE — Telephone Encounter (Signed)
Pt has a history of PE. Was told to start lovenox when she gets pregnant. Patient is currently [redacted]w[redacted]d and has pregnancy confirmed by ultrasound.

## 2021-08-11 NOTE — Progress Notes (Signed)
[place] 8+5 wks,single IUP,FHR 178 bpm,CRL 24.48 mm,normal ovaries

## 2021-08-12 ENCOUNTER — Other Ambulatory Visit (HOSPITAL_COMMUNITY): Payer: Self-pay

## 2021-08-12 MED ORDER — ENOXAPARIN SODIUM 40 MG/0.4ML IJ SOSY
40.0000 mg | PREFILLED_SYRINGE | INTRAMUSCULAR | 11 refills | Status: AC
  Filled 2021-08-12: qty 12, 30d supply, fill #0
  Filled 2021-09-14: qty 12, 30d supply, fill #1
  Filled 2021-11-18: qty 12, 30d supply, fill #2

## 2021-09-03 ENCOUNTER — Other Ambulatory Visit: Payer: Self-pay | Admitting: Obstetrics & Gynecology

## 2021-09-03 DIAGNOSIS — Z3682 Encounter for antenatal screening for nuchal translucency: Secondary | ICD-10-CM

## 2021-09-06 ENCOUNTER — Ambulatory Visit: Payer: No Typology Code available for payment source | Admitting: *Deleted

## 2021-09-06 ENCOUNTER — Ambulatory Visit (INDEPENDENT_AMBULATORY_CARE_PROVIDER_SITE_OTHER): Payer: No Typology Code available for payment source

## 2021-09-06 ENCOUNTER — Ambulatory Visit (INDEPENDENT_AMBULATORY_CARE_PROVIDER_SITE_OTHER): Payer: No Typology Code available for payment source | Admitting: Women's Health

## 2021-09-06 ENCOUNTER — Encounter: Payer: Self-pay | Admitting: Women's Health

## 2021-09-06 VITALS — BP 121/74 | HR 79 | Wt 184.0 lb

## 2021-09-06 DIAGNOSIS — Z1379 Encounter for other screening for genetic and chromosomal anomalies: Secondary | ICD-10-CM

## 2021-09-06 DIAGNOSIS — Z86711 Personal history of pulmonary embolism: Secondary | ICD-10-CM

## 2021-09-06 DIAGNOSIS — Z3481 Encounter for supervision of other normal pregnancy, first trimester: Secondary | ICD-10-CM

## 2021-09-06 DIAGNOSIS — R000 Tachycardia, unspecified: Secondary | ICD-10-CM

## 2021-09-06 DIAGNOSIS — Z3682 Encounter for antenatal screening for nuchal translucency: Secondary | ICD-10-CM | POA: Diagnosis not present

## 2021-09-06 DIAGNOSIS — Z3A12 12 weeks gestation of pregnancy: Secondary | ICD-10-CM

## 2021-09-06 DIAGNOSIS — Z3480 Encounter for supervision of other normal pregnancy, unspecified trimester: Secondary | ICD-10-CM

## 2021-09-06 DIAGNOSIS — Z3490 Encounter for supervision of normal pregnancy, unspecified, unspecified trimester: Secondary | ICD-10-CM | POA: Insufficient documentation

## 2021-09-06 LAB — POCT URINALYSIS DIPSTICK OB
Blood, UA: NEGATIVE
Glucose, UA: NEGATIVE
Ketones, UA: NEGATIVE
Leukocytes, UA: NEGATIVE
Nitrite, UA: NEGATIVE
POC,PROTEIN,UA: NEGATIVE

## 2021-09-06 NOTE — Progress Notes (Signed)
[place] 12+3 wks,measurements c/w dates,FHR 153 bpm,anterior placenta gr 0,normal right ovary,left ovary not visualized,NB present,NT 1.5 mm

## 2021-09-06 NOTE — Progress Notes (Signed)
INITIAL OBSTETRICAL VISIT Patient name: Shirley Peters MRN [id number]  Date of birth: 1990-06-08 Chief Complaint:   Initial Prenatal Visit (Feeling heart racing)  History of Present Illness:   Shirley Peters is a 31 y.o. G73P1001 Caucasian female at [redacted]w[redacted]d by LMP c/w u/s at 9 weeks with an Estimated Date of Delivery: 03/18/22 being seen today for her initial obstetrical visit.   Patient's last menstrual period was 06/11/2021. Her obstetrical history is significant for  uncomplicated term SVB x 1 in 2010, baby weighed 9lb5oz, denies shoulder dystocia or GDM .   H/O provoked PE in 2021 (on COCs and vaping), mom died from PE @ pregnant (pt was 31yo). Started Lovenox 40mg  daily on 08/12/21 On metoprolol 50mg  daily for tachycardia and palpitations s/p PE, was doing well, has noticed has more tachycardia & palpitations w/ some mild sob at night since pregnant. Dr. 10/12/21 is her cardiologist, hasn't seen him since March 2022.  Last pap 04/10/19. Results were: NILM w/ HRHPV negative     09/06/2021   10:10 AM 03/22/2021    3:01 PM 01/21/2020    8:23 AM 04/10/2019    9:25 AM  Depression screen PHQ 2/9  Decreased Interest 0 0 0 0  Down, Depressed, Hopeless 0 0 0 0  PHQ - 2 Score 0 0 0 0  Altered sleeping 2 1  1   Tired, decreased energy 2 1  1   Change in appetite 0 0  0  Feeling bad or failure about yourself  0 0  0  Trouble concentrating 0 0  0  Moving slowly or fidgety/restless 0 0  0  Suicidal thoughts 0 0  0  PHQ-9 Score 4 2  2   Difficult doing work/chores    Not difficult at all        09/06/2021   10:10 AM 03/22/2021    3:01 PM 04/10/2019    9:26 AM  GAD 7 : Generalized Anxiety Score  Nervous, Anxious, on Edge 0 0 0  Control/stop worrying 0 0 0  Worry too much - different things 0 1 0  Trouble relaxing 0 1 0  Restless 0 0 0  Easily annoyed or irritable 1 1 1   Afraid - awful might happen 0 0 0  Total GAD 7 Score 1 3 1   Anxiety Difficulty   Not difficult at all     Review of  Systems:   Pertinent items are noted in HPI Denies cramping/contractions, leakage of fluid, vaginal bleeding, abnormal vaginal discharge w/ itching/odor/irritation, headaches, visual changes, shortness of breath, chest pain, abdominal pain, severe nausea/vomiting, or problems with urination or bowel movements unless otherwise stated above.  Pertinent History Reviewed:  Reviewed past medical,surgical, social, obstetrical and family history.  Reviewed problem list, medications and allergies. OB History  Gravida Para Term Preterm AB Living  2 1 1     1   SAB IAB Ectopic Multiple Live Births          1    # Outcome Date GA Lbr Len/2nd Weight Sex Delivery Anes PTL Lv  2 Current           1 Term 02/15/08 105w5d  9 lb 5 oz (4.224 kg) M Vag-Spont EPI N LIV   Physical Assessment:   Vitals:   09/06/21 1006  BP: 121/74  Pulse: 79  Weight: 184 lb (83.5 kg)  Body mass index is 28.82 kg/m.       Physical Examination:  General appearance - well  appearing, and in no distress  Mental status - alert, oriented to person, place, and time  Psych:  She has a normal mood and affect  Skin - warm and dry, normal color, no suspicious lesions noted  Chest - effort normal, all lung fields clear to auscultation bilaterally  Heart - normal rate and regular rhythm  Abdomen - soft, nontender  Extremities:  No swelling or varicosities noted  Thin prep pap is not done   Chaperone: N/A    TODAY'S NT [place] 12+3 wks,measurements c/w dates,FHR 153 bpm,anterior placenta gr 0,normal right ovary,left ovary not visualized,NB present,CRL 68.86 mm,NT 1.5 mm  Results for orders placed or performed in visit on 09/06/21 (from the past 24 hour(s))  POC Urinalysis Dipstick OB   Collection Time: 09/06/21 11:20 AM  Result Value Ref Range   Color, UA     Clarity, UA     Glucose, UA Negative Negative   Bilirubin, UA     Ketones, UA neg    Spec Grav, UA     Blood, UA neg    pH, UA     POC,PROTEIN,UA Negative Negative,  Trace, Small (1+), Moderate (2+), Large (3+), 4+   Urobilinogen, UA     Nitrite, UA neg    Leukocytes, UA Negative Negative   Appearance     Odor      Assessment & Plan:  1) Low-Risk Pregnancy G2P1001 at [redacted]w[redacted]d with an Estimated Date of Delivery: 03/18/22   2) Initial OB visit  3) H/O provoked PE> on Lovenox 40mg  daily, plan to continue til 6wk pp  4) Tachycardia and palpitations> on metoprolol 50mg , has noticed increase in sx w/ pregnancy, Dr. is her cardiologist- discussed can be normal d/t physiological changes during pregnancy, but recommended to call to schedule appt w/ him  Meds: No orders of the defined types were placed in this encounter.   Initial labs obtained Continue prenatal vitamins Reviewed n/v relief measures and warning s/s to report Reviewed recommended weight gain based on pre-gravid BMI Encouraged well-balanced diet Genetic & carrier screening discussed: requests Panorama, NT/IT, and Horizon , requests Panorama, NT/IT, and Horizon  Ultrasound discussed; fetal survey: requested CCNC completed> form faxed if has or is planning to apply for medicaid The nature of for [name] with multiple MDs and other Advanced Practice Providers was explained to patient; also emphasized that fellows, residents, and students are part of our team. Does have home bp cuff. Office bp cuff given: no. Rx sent: n/a. Check bp weekly, let [employer] know if consistently >140/90.   Follow-up: Return in about 4 weeks (around 10/04/2021) for LROB, 2nd IT, MD or CNM; then 7wks from now for anatomy u/s and visit w/ cnm.   Orders Placed This Encounter  Procedures   Urine Culture   GC/Chlamydia Probe Amp   CBC/D/Plt+RPR+Rh+ABO+RubIgG...   Panorama Prenatal Test Full Panel   HORIZON CUSTOM   Integrated 1   POC Urinalysis Dipstick OB    [place] CNM, [facility] 09/06/2021 1:24 PM

## 2021-09-06 NOTE — Patient Instructions (Signed)
Shirley Peters, thank you for choosing our office today! We appreciate the opportunity to meet your healthcare needs. You may receive a short survey by mail, e-mail, or through [employer]. If you are happy with your care we would appreciate if you could take just a few minutes to complete the survey questions. We read all of your comments and take your feedback very seriously. Thank you again for choosing our office.  Center for [employer] [facility] Team at [facility]  [facility] at [facility] ([address] [place], [place] [address]) Entrance C, located off of [address] Free 24/7 valet parking   Nausea & Vomiting Have saltine crackers or pretzels by your bed and eat a few bites before you raise your head out of bed in the morning Eat small frequent meals throughout the day instead of large meals Drink plenty of fluids throughout the day to stay hydrated, just don't drink a lot of fluids with your meals.  This can make your stomach fill up faster making you feel sick Do not brush your teeth right after you eat Products with real ginger are good for nausea, like ginger ale and ginger hard candy Make sure it says made with real ginger! Sucking on sour candy like lemon heads is also good for nausea If your prenatal vitamins make you nauseated, take them at night so you will sleep through the nausea Sea Bands If you feel like you need medicine for the nausea & vomiting please let us know If you are unable to keep any fluids or food down please let us know   Constipation Drink plenty of fluid, preferably water, throughout the day Eat foods high in fiber such as fruits, vegetables, and grains Exercise, such as walking, is a good way to keep your bowels regular Drink warm fluids, especially warm prune juice, or decaf coffee Eat a 1/2 cup of real oatmeal (not instant), 1/2 cup applesauce, and 1/2-1 cup warm prune juice every day If needed, you may take Colace (docusate sodium) stool softener  once or twice a day to help keep the stool soft.  If you still are having problems with constipation, you may take Miralax once daily as needed to help keep your bowels regular.   Home Blood Pressure Monitoring for Patients   Your provider has recommended that you check your blood pressure (BP) at least once a week at home. If you do not have a blood pressure cuff at home, one will be provided for you. Contact your provider if you have not received your monitor within 1 week.   Helpful Tips for Accurate Home Blood Pressure Checks  Don't smoke, exercise, or drink caffeine 30 minutes before checking your BP Use the restroom before checking your BP (a full bladder can raise your pressure) Relax in a comfortable upright chair Feet on the ground Left arm resting comfortably on a flat surface at the level of your heart Legs uncrossed Back supported Sit quietly and don't talk Place the cuff on your bare arm Adjust snuggly, so that only two fingertips can fit between your skin and the top of the cuff Check 2 readings separated by at least one minute Keep a log of your BP readings For a visual, please reference this diagram: http://ccnc.care/bpdiagram  Provider Name: [facility]     Phone: [phone]  Zone 1: ALL CLEAR  Continue to monitor your symptoms:  BP reading is less than 140 (top number) or less than 90 (bottom  number)  No right upper stomach pain No headaches or seeing spots No feeling nauseated or throwing up No swelling in face and hands  Zone 2: CAUTION Call your doctor's office for any of the following:  BP reading is greater than 140 (top number) or greater than 90 (bottom number)  Stomach pain under your ribs in the middle or right side Headaches or seeing spots Feeling nauseated or throwing up Swelling in face and hands  Zone 3: EMERGENCY  Seek immediate medical care if you have any of the following:  BP reading is greater than160 (top number) or greater than  110 (bottom number) Severe headaches not improving with Tylenol Serious difficulty catching your breath Any worsening symptoms from Zone 2    First Trimester of Pregnancy The first trimester of pregnancy is from week 1 until the end of week 12 (months 1 through 3). A week after a sperm fertilizes an egg, the egg will implant on the wall of the uterus. This embryo will begin to develop into a baby. Genes from you and your partner are forming the baby. The female genes determine whether the baby is a boy or a girl. At 6-8 weeks, the eyes and face are formed, and the heartbeat can be seen on ultrasound. At the end of 12 weeks, all the baby's organs are formed.  Now that you are pregnant, you will want to do everything you can to have a healthy baby. Two of the most important things are to get good prenatal care and to follow your health care provider's instructions. Prenatal care is all the medical care you receive before the baby's birth. This care will help prevent, find, and treat any problems during the pregnancy and childbirth. BODY CHANGES Your body goes through many changes during pregnancy. The changes vary from woman to woman.  You may gain or lose a couple of pounds at first. You may feel sick to your stomach (nauseous) and throw up (vomit). If the vomiting is uncontrollable, call your health care provider. You may tire easily. You may develop headaches that can be relieved by medicines approved by your health care provider. You may urinate more often. Painful urination may mean you have a bladder infection. You may develop heartburn as a result of your pregnancy. You may develop constipation because certain hormones are causing the muscles that push waste through your intestines to slow down. You may develop hemorrhoids or swollen, bulging veins (varicose veins). Your breasts may begin to grow larger and become tender. Your nipples may stick out more, and the tissue that surrounds them  (areola) may become darker. Your gums may bleed and may be sensitive to brushing and flossing. Dark spots or blotches (chloasma, mask of pregnancy) may develop on your face. This will likely fade after the baby is born. Your menstrual periods will stop. You may have a loss of appetite. You may develop cravings for certain kinds of food. You may have changes in your emotions from day to day, such as being excited to be pregnant or being concerned that something may go wrong with the pregnancy and baby. You may have more vivid and strange dreams. You may have changes in your hair. These can include thickening of your hair, rapid growth, and changes in texture. Some women also have hair loss during or after pregnancy, or hair that feels dry or thin. Your hair will most likely return to normal after your baby is born. WHAT TO EXPECT AT YOUR PRENATAL  VISITS During a routine prenatal visit: You will be weighed to make sure you and the baby are growing normally. Your blood pressure will be taken. Your abdomen will be measured to track your baby's growth. The fetal heartbeat will be listened to starting around week 10 or 12 of your pregnancy. Test results from any previous visits will be discussed. Your health care provider may ask you: How you are feeling. If you are feeling the baby move. If you have had any abnormal symptoms, such as leaking fluid, bleeding, severe headaches, or abdominal cramping. If you have any questions. Other tests that may be performed during your first trimester include: Blood tests to find your blood type and to check for the presence of any previous infections. They will also be used to check for low iron levels (anemia) and Rh antibodies. Later in the pregnancy, blood tests for diabetes will be done along with other tests if problems develop. Urine tests to check for infections, diabetes, or protein in the urine. An ultrasound to confirm the proper growth and development  of the baby. An amniocentesis to check for possible genetic problems. Fetal screens for spina bifida and Down syndrome. You may need other tests to make sure you and the baby are doing well. HOME CARE INSTRUCTIONS  Medicines Follow your health care provider's instructions regarding medicine use. Specific medicines may be either safe or unsafe to take during pregnancy. Take your prenatal vitamins as directed. If you develop constipation, try taking a stool softener if your health care provider approves. Diet Eat regular, well-balanced meals. Choose a variety of foods, such as meat or vegetable-based protein, fish, milk and low-fat dairy products, vegetables, fruits, and whole grain breads and cereals. Your health care provider will help you determine the amount of weight gain that is right for you. Avoid raw meat and uncooked cheese. These carry germs that can cause birth defects in the baby. Eating four or five small meals rather than three large meals a day may help relieve nausea and vomiting. If you start to feel nauseous, eating a few soda crackers can be helpful. Drinking liquids between meals instead of during meals also seems to help nausea and vomiting. If you develop constipation, eat more high-fiber foods, such as fresh vegetables or fruit and whole grains. Drink enough fluids to keep your urine clear or pale yellow. Activity and Exercise Exercise only as directed by your health care provider. Exercising will help you: Control your weight. Stay in shape. Be prepared for labor and delivery. Experiencing pain or cramping in the lower abdomen or low back is a good sign that you should stop exercising. Check with your health care provider before continuing normal exercises. Try to avoid standing for long periods of time. Move your legs often if you must stand in one place for a long time. Avoid heavy lifting. Wear low-heeled shoes, and practice good posture. You may continue to have sex  unless your health care provider directs you otherwise. Relief of Pain or Discomfort Wear a good support bra for breast tenderness.   Take warm sitz baths to soothe any pain or discomfort caused by hemorrhoids. Use hemorrhoid cream if your health care provider approves.   Rest with your legs elevated if you have leg cramps or low back pain. If you develop varicose veins in your legs, wear support hose. Elevate your feet for 15 minutes, 3-4 times a day. Limit salt in your diet. Prenatal Care Schedule your prenatal visits by the  twelfth week of pregnancy. They are usually scheduled monthly at first, then more often in the last 2 months before delivery. Write down your questions. Take them to your prenatal visits. Keep all your prenatal visits as directed by your health care provider. Safety Wear your seat belt at all times when driving. Make a list of emergency phone numbers, including numbers for family, friends, the hospital, and police and fire departments. General Tips Ask your health care provider for a referral to a local prenatal education class. Begin classes no later than at the beginning of month 6 of your pregnancy. Ask for help if you have counseling or nutritional needs during pregnancy. Your health care provider can offer advice or refer you to specialists for help with various needs. Do not use hot tubs, steam rooms, or saunas. Do not douche or use tampons or scented sanitary pads. Do not cross your legs for long periods of time. Avoid cat litter boxes and soil used by cats. These carry germs that can cause birth defects in the baby and possibly loss of the fetus by miscarriage or stillbirth. Avoid all smoking, herbs, alcohol, and medicines not prescribed by your health care provider. Chemicals in these affect the formation and growth of the baby. Schedule a dentist appointment. At home, brush your teeth with a soft toothbrush and be gentle when you floss. SEEK MEDICAL CARE IF:   You have dizziness. You have mild pelvic cramps, pelvic pressure, or nagging pain in the abdominal area. You have persistent nausea, vomiting, or diarrhea. You have a bad smelling vaginal discharge. You have pain with urination. You notice increased swelling in your face, hands, legs, or ankles. SEEK IMMEDIATE MEDICAL CARE IF:  You have a fever. You are leaking fluid from your vagina. You have spotting or bleeding from your vagina. You have severe abdominal cramping or pain. You have rapid weight gain or loss. You vomit blood or material that looks like coffee grounds. You are exposed to [place] measles and have never had them. You are exposed to fifth disease or chickenpox. You develop a severe headache. You have shortness of breath. You have any kind of trauma, such as from a fall or a car accident. Document Released: 12/21/2000 Document Revised: 05/13/2013 Document Reviewed: 11/06/2012 [facility] Patient Information 2015 [place], [place]. This information is not intended to replace advice given to you by your health care provider. Make sure you discuss any questions you have with your health care provider.

## 2021-09-07 LAB — OB RESULTS CONSOLE GC/CHLAMYDIA
Chlamydia: NEGATIVE
Neisseria Gonorrhea: NEGATIVE

## 2021-09-07 LAB — GC/CHLAMYDIA PROBE AMP
Chlamydia trachomatis, NAA: NEGATIVE
Neisseria Gonorrhoeae by PCR: NEGATIVE

## 2021-09-08 LAB — URINE CULTURE

## 2021-09-08 LAB — OB RESULTS CONSOLE HIV ANTIBODY (ROUTINE TESTING): HIV: NONREACTIVE

## 2021-09-08 LAB — OB RESULTS CONSOLE RPR: RPR: NONREACTIVE

## 2021-09-08 LAB — OB RESULTS CONSOLE RUBELLA ANTIBODY, IGM: Rubella: IMMUNE

## 2021-09-08 LAB — OB RESULTS CONSOLE HEPATITIS B SURFACE ANTIGEN: Hepatitis B Surface Ag: NEGATIVE

## 2021-09-08 LAB — HEPATITIS C ANTIBODY: HCV Ab: NEGATIVE

## 2021-09-09 LAB — CBC/D/PLT+RPR+RH+ABO+RUBIGG...
Antibody Screen: NEGATIVE
Basophils Absolute: 0 10*3/uL (ref 0.0–0.2)
Basos: 0 %
EOS (ABSOLUTE): 0.1 10*3/uL (ref 0.0–0.4)
Eos: 1 %
HCV Ab: NONREACTIVE
HIV Screen 4th Generation wRfx: NONREACTIVE
Hematocrit: 37.1 % (ref 34.0–46.6)
Hemoglobin: 12.7 g/dL (ref 11.1–15.9)
Hepatitis B Surface Ag: NEGATIVE
Immature Grans (Abs): 0 10*3/uL (ref 0.0–0.1)
Immature Granulocytes: 0 %
Lymphocytes Absolute: 1.5 10*3/uL (ref 0.7–3.1)
Lymphs: 21 %
MCH: 30 pg (ref 26.6–33.0)
MCHC: 34.2 g/dL (ref 31.5–35.7)
MCV: 88 fL (ref 79–97)
Monocytes Absolute: 0.4 10*3/uL (ref 0.1–0.9)
Monocytes: 5 %
Neutrophils Absolute: 5.2 10*3/uL (ref 1.4–7.0)
Neutrophils: 73 %
Platelets: 233 10*3/uL (ref 150–450)
RBC: 4.24 x10E6/uL (ref 3.77–5.28)
RDW: 13.1 % (ref 11.7–15.4)
RPR Ser Ql: NONREACTIVE
Rh Factor: POSITIVE
Rubella Antibodies, IGG: 1.65 index (ref >0.99)
WBC: 7.1 10*3/uL (ref 3.4–10.8)

## 2021-09-09 LAB — HCV INTERPRETATION

## 2021-09-10 ENCOUNTER — Other Ambulatory Visit: Payer: No Typology Code available for payment source

## 2021-09-10 ENCOUNTER — Encounter: Payer: No Typology Code available for payment source | Admitting: Advanced Practice Midwife

## 2021-09-10 ENCOUNTER — Ambulatory Visit: Payer: No Typology Code available for payment source | Admitting: *Deleted

## 2021-09-10 LAB — INTEGRATED 1
Crown Rump Length: 68.9 mm
Gest. Age on Collection Date: 13.3 weeks
Maternal Age at EDD: 33.3 yr
Nuchal Translucency (NT): 1.5 mm
Number of Fetuses: 1
PAPP-A Value: 597.6 ng/mL
Weight: 184 [lb_av]

## 2021-09-13 LAB — PANORAMA PRENATAL TEST FULL PANEL:PANORAMA TEST PLUS 5 ADDITIONAL MICRODELETIONS: FETAL FRACTION: 9.4

## 2021-09-14 ENCOUNTER — Other Ambulatory Visit (HOSPITAL_COMMUNITY): Payer: Self-pay

## 2021-09-14 ENCOUNTER — Other Ambulatory Visit: Payer: No Typology Code available for payment source

## 2021-09-19 LAB — HORIZON CUSTOM: REPORT SUMMARY: NEGATIVE

## 2021-10-04 ENCOUNTER — Encounter: Payer: Self-pay | Admitting: Women's Health

## 2021-10-04 ENCOUNTER — Ambulatory Visit (INDEPENDENT_AMBULATORY_CARE_PROVIDER_SITE_OTHER): Payer: No Typology Code available for payment source | Admitting: Women's Health

## 2021-10-04 VITALS — BP 121/75 | HR 85 | Wt 186.2 lb

## 2021-10-04 DIAGNOSIS — Z3480 Encounter for supervision of other normal pregnancy, unspecified trimester: Secondary | ICD-10-CM

## 2021-10-04 DIAGNOSIS — Z3482 Encounter for supervision of other normal pregnancy, second trimester: Secondary | ICD-10-CM

## 2021-10-04 DIAGNOSIS — Z3A16 16 weeks gestation of pregnancy: Secondary | ICD-10-CM

## 2021-10-04 DIAGNOSIS — R000 Tachycardia, unspecified: Secondary | ICD-10-CM

## 2021-10-04 DIAGNOSIS — Z363 Encounter for antenatal screening for malformations: Secondary | ICD-10-CM

## 2021-10-04 NOTE — Progress Notes (Signed)
LOW-RISK PREGNANCY VISIT Patient name: Shirley Peters MRN [id number]  Date of birth: May 13, 1990 Chief Complaint:   Routine Prenatal Visit (Lower abdominal tenderness-started yesterday)  History of Present Illness:   Shirley Peters is a 31 y.o. G40P1001 female at [redacted]w[redacted]d with an Estimated Date of Delivery: 03/18/22 being seen today for ongoing management of a low-risk pregnancy.   Today she reports  lower abd tenderness since yesterday . Denies abnormal discharge, itching/odor/irritation.  No UTI sx, vb, lof. Headaches, takes apap prn. Did drink a lot of caffeine prior to pregnancy, now keeps @ 200mg  or less/day. On metroprolol 50mg  XL for tachycardia/palpitations, hr still gets to 140s when walking from car to work, sometimes chest feels tight at night. Couldn't get in w/ her cardiologist until March.  Contractions: Not present. Vag. Bleeding: None.  Movement: Present. denies leaking of fluid.     09/06/2021   10:10 AM 03/22/2021    3:01 PM 01/21/2020    8:23 AM 04/10/2019    9:25 AM  Depression screen PHQ 2/9  Decreased Interest 0 0 0 0  Down, Depressed, Hopeless 0 0 0 0  PHQ - 2 Score 0 0 0 0  Altered sleeping 2 1  1   Tired, decreased energy 2 1  1   Change in appetite 0 0  0  Feeling bad or failure about yourself  0 0  0  Trouble concentrating 0 0  0  Moving slowly or fidgety/restless 0 0  0  Suicidal thoughts 0 0  0  PHQ-9 Score 4 2  2   Difficult doing work/chores    Not difficult at all        09/06/2021   10:10 AM 03/22/2021    3:01 PM 04/10/2019    9:26 AM  GAD 7 : Generalized Anxiety Score  Nervous, Anxious, on Edge 0 0 0  Control/stop worrying 0 0 0  Worry too much - different things 0 1 0  Trouble relaxing 0 1 0  Restless 0 0 0  Easily annoyed or irritable 1 1 1   Afraid - awful might happen 0 0 0  Total GAD 7 Score 1 3 1   Anxiety Difficulty   Not difficult at all      Review of Systems:   Pertinent items are noted in HPI Denies abnormal vaginal discharge w/  itching/odor/irritation, headaches, visual changes, shortness of breath, chest pain, abdominal pain, severe nausea/vomiting, or problems with urination or bowel movements unless otherwise stated above. Pertinent History Reviewed:  Reviewed past medical,surgical, social, obstetrical and family history.  Reviewed problem list, medications and allergies. Physical Assessment:   Vitals:   10/04/21 0847  BP: 121/75  Pulse: 85  Weight: 186 lb 3.2 oz (84.5 kg)  Body mass index is 29.16 kg/m.        Physical Examination:   General appearance: Well appearing, and in no distress  Mental status: Alert, oriented to person, place, and time  Skin: Warm & dry  Cardiovascular: Normal heart rate noted  Respiratory: Normal respiratory effort, no distress  Abdomen: Soft, gravid, nontender  Pelvic: Cervical exam deferred         Extremities: Edema: None  Fetal Status: Fetal Heart Rate (bpm): 154   Movement: Present    Chaperone: N/A   No results found for this or any previous visit (from the past 24 hour(s)).  Assessment & Plan:  1) Low-risk pregnancy G2P1001 at [redacted]w[redacted]d with an Estimated Date of Delivery: 03/18/22   2) Lower abd  tenderness, discussed potential causes, offered CV swab, declined- will let us know if continues/changes mind  3) Headaches> gave printed prevention/relief measures   4) Tachycardia/palpitations, 140s w/ exertion> on metoprolol 50mg  XL, can't get in w/ her cardiologist til March. Offered referral to [facility] cards, would like- ordered today. Discussed potential for FGR w/ metoprolol and pros/cons of switching to labetalol- can discuss further w/ OB cards.    Meds: No orders of the defined types were placed in this encounter.  Labs/procedures today: 2nd IT  Plan:  Continue routine obstetrical care  Next visit: prefers will be in person for u/s     Reviewed: Preterm labor symptoms and general obstetric precautions including but not limited to vaginal bleeding, contractions,  leaking of fluid and fetal movement were reviewed in detail with the patient.  All questions were answered. Does have home bp cuff. Office bp cuff given: not applicable. Check bp weekly, let [facility] know if consistently >140 and/or >90.  Follow-up: Return for As scheduled.  Future Appointments  Date Time Provider Department Center  10/25/2021  1:30 PM [facility] None  10/25/2021  2:30 PM 10/27/2021, CNM [facility]  03/24/2022 11:00 AM 03/26/2022, MD [facility] [place] [facility]    Orders Placed This Encounter  Procedures   INTEGRATED 2   AMB Referral to Cardio Obstetrics   Jonelle Sidle CNM, [facility] 10/04/2021 9:13 AM

## 2021-10-04 NOTE — Patient Instructions (Signed)
Shirley Peters, thank you for choosing our office today! We appreciate the opportunity to meet your healthcare needs. You may receive a short survey by mail, e-mail, or through MyChart. If you are happy with your care we would appreciate if you could take just a few minutes to complete the survey questions. We read all of your comments and take your feedback very seriously. Thank you again for choosing our office.  [facility] Team at [facility] ([address] [place], [place] [address]) Entrance C, located off of [address] Free 24/7 valet parking  Go to [email or web address] to register for FREE online childbirth classes  Call the office ([phone]) or go to [facility] if: You begin to severe cramping Your water breaks.  Sometimes it is a big gush of fluid, sometimes it is just a trickle that keeps getting your panties wet or running down your legs You have vaginal bleeding.  It is normal to have a small amount of spotting if your cervix was checked.   [place] [facility] ([facility]): [address], [phone]           [facility]: [address], [phone]                [facility] ([facility]): [address], [phone] (call to ask if accepting patients) [facility]: [address], [place], [phone]    [place] [facility] ([facility]): [address], [phone] [facility]: [address], [phone] [facility]: [address], [phone]  [place] [facility]  [facility] ([facility]): [phone] [facility]: [address], [phone]   [place] [facility]: [address], [phone]  [facility]  [facility]: [address], [phone]  Home Blood Pressure Monitoring for Patients   Your provider has recommended that you check your blood pressure (BP) at least once a week at home. If you do not have a blood pressure cuff at home, one will be provided for you. Contact your provider if you have not received your monitor within 1 week.   Helpful Tips for Accurate Home Blood Pressure Checks  Don't smoke, exercise, or drink caffeine 30 minutes before checking your BP Use the restroom before checking your BP (a full bladder can raise your pressure) Relax in a comfortable upright chair Feet on the ground Left arm resting comfortably on a flat surface at the level of your heart Legs uncrossed Back supported Sit quietly and don't talk Place the cuff on your bare arm Adjust snuggly, so that only two fingertips can fit between your skin and the top of the cuff Check 2 readings separated by at least one minute Keep a log of your BP readings For a visual, please reference this diagram: http://[facility].care/bpdiagram  Provider Name: [facility]     Phone: [phone]  Zone 1: ALL CLEAR  Continue to monitor your symptoms:  BP reading is less than 140 (top number) or less than 90 (bottom number)  No right upper stomach pain No headaches or seeing spots No feeling nauseated or throwing up No swelling in face and hands  Zone 2: CAUTION Call your doctor's office for any of the following:  BP reading is greater than 140 (top number) or greater than   90 (bottom number)  Stomach pain under your ribs in the middle or right side Headaches or seeing spots Feeling nauseated or throwing up Swelling in face and hands  Zone 3: EMERGENCY  Seek immediate medical care if you have any of the following:  BP reading is greater than160 (top number) or greater than 110 (bottom number) Severe headaches not improving with Tylenol Serious difficulty catching your breath Any worsening symptoms from  Zone 2     Second Trimester of Pregnancy The second trimester is from week 14 through week 27 (months 4 through 6). The second trimester is often a time when you feel your best. Your body has adjusted to being pregnant, and you begin to feel better physically. Usually, morning sickness has lessened or quit completely, you may have more energy, and you may have an increase in appetite. The second trimester is also a time when the fetus is growing rapidly. At the end of the sixth month, the fetus is about 9 inches long and weighs about 1 pounds. You will likely begin to feel the baby move (quickening) between 16 and 20 weeks of pregnancy. Body changes during your second trimester Your body continues to go through many changes during your second trimester. The changes vary from woman to woman. Your weight will continue to increase. You will notice your lower abdomen bulging out. You may begin to get stretch marks on your hips, abdomen, and breasts. You may develop headaches that can be relieved by medicines. The medicines should be approved by your health care provider. You may urinate more often because the fetus is pressing on your bladder. You may develop or continue to have heartburn as a result of your pregnancy. You may develop constipation because certain hormones are causing the muscles that push waste through your intestines to slow down. You may develop hemorrhoids or swollen, bulging veins (varicose veins). You may have back pain. This is caused by: Weight gain. Pregnancy hormones that are relaxing the joints in your pelvis. A shift in weight and the muscles that support your balance. Your breasts will continue to grow and they will continue to become tender. Your gums may bleed and may be sensitive to brushing and flossing. Dark spots or blotches (chloasma, mask of pregnancy) may develop on your face. This will likely fade after the baby is born. A dark line from your belly button to  the pubic area (linea nigra) may appear. This will likely fade after the baby is born. You may have changes in your hair. These can include thickening of your hair, rapid growth, and changes in texture. Some women also have hair loss during or after pregnancy, or hair that feels dry or thin. Your hair will most likely return to normal after your baby is born.  What to expect at prenatal visits During a routine prenatal visit: You will be weighed to make sure you and the fetus are growing normally. Your blood pressure will be taken. Your abdomen will be measured to track your baby's growth. The fetal heartbeat will be listened to. Any test results from the previous visit will be discussed.  Your health care provider may ask you: How you are feeling. If you are feeling the baby move. If you have had any abnormal symptoms, such as leaking fluid, bleeding, severe headaches, or abdominal cramping. If you are using any tobacco products, including cigarettes, chewing tobacco, and electronic cigarettes. If you have any questions.  Other tests that may be performed during   your second trimester include: Blood tests that check for: Low iron levels (anemia). High blood sugar that affects pregnant women (gestational diabetes) between 24 and 28 weeks. Rh antibodies. This is to check for a protein on red blood cells (Rh factor). Urine tests to check for infections, diabetes, or protein in the urine. An ultrasound to confirm the proper growth and development of the baby. An amniocentesis to check for possible genetic problems. Fetal screens for spina bifida and Down syndrome. HIV (human immunodeficiency virus) testing. Routine prenatal testing includes screening for HIV, unless you choose not to have this test.  Follow these instructions at home: Medicines Follow your health care provider's instructions regarding medicine use. Specific medicines may be either safe or unsafe to take during  pregnancy. Take a prenatal vitamin that contains at least 600 micrograms (mcg) of folic acid. If you develop constipation, try taking a stool softener if your health care provider approves. Eating and drinking Eat a balanced diet that includes fresh fruits and vegetables, whole grains, good sources of protein such as meat, eggs, or tofu, and low-fat dairy. Your health care provider will help you determine the amount of weight gain that is right for you. Avoid raw meat and uncooked cheese. These carry germs that can cause birth defects in the baby. If you have low calcium intake from food, talk to your health care provider about whether you should take a daily calcium supplement. Limit foods that are high in fat and processed sugars, such as fried and sweet foods. To prevent constipation: Drink enough fluid to keep your urine clear or pale yellow. Eat foods that are high in fiber, such as fresh fruits and vegetables, whole grains, and beans. Activity Exercise only as directed by your health care provider. Most women can continue their usual exercise routine during pregnancy. Try to exercise for 30 minutes at least 5 days a week. Stop exercising if you experience uterine contractions. Avoid heavy lifting, wear low heel shoes, and practice good posture. A sexual relationship may be continued unless your health care provider directs you otherwise. Relieving pain and discomfort Wear a good support bra to prevent discomfort from breast tenderness. Take warm sitz baths to soothe any pain or discomfort caused by hemorrhoids. Use hemorrhoid cream if your health care provider approves. Rest with your legs elevated if you have leg cramps or low back pain. If you develop varicose veins, wear support hose. Elevate your feet for 15 minutes, 3-4 times a day. Limit salt in your diet. Prenatal Care Write down your questions. Take them to your prenatal visits. Keep all your prenatal visits as told by your health  care provider. This is important. Safety Wear your seat belt at all times when driving. Make a list of emergency phone numbers, including numbers for family, friends, the hospital, and police and fire departments. General instructions Ask your health care provider for a referral to a local prenatal education class. Begin classes no later than the beginning of month 6 of your pregnancy. Ask for help if you have counseling or nutritional needs during pregnancy. Your health care provider can offer advice or refer you to specialists for help with various needs. Do not use hot tubs, steam rooms, or saunas. Do not douche or use tampons or scented sanitary pads. Do not cross your legs for long periods of time. Avoid cat litter boxes and soil used by cats. These carry germs that can cause birth defects in the baby and possibly loss of the   fetus by miscarriage or stillbirth. Avoid all smoking, herbs, alcohol, and unprescribed drugs. Chemicals in these products can affect the formation and growth of the baby. Do not use any products that contain nicotine or tobacco, such as cigarettes and e-cigarettes. If you need help quitting, ask your health care provider. Visit your dentist if you have not gone yet during your pregnancy. Use a soft toothbrush to brush your teeth and be gentle when you floss. Contact a health care provider if: You have dizziness. You have mild pelvic cramps, pelvic pressure, or nagging pain in the abdominal area. You have persistent nausea, vomiting, or diarrhea. You have a bad smelling vaginal discharge. You have pain when you urinate. Get help right away if: You have a fever. You are leaking fluid from your vagina. You have spotting or bleeding from your vagina. You have severe abdominal cramping or pain. You have rapid weight gain or weight loss. You have shortness of breath with chest pain. You notice sudden or extreme swelling of your face, hands, ankles, feet, or legs. You  have not felt your baby move in over an hour. You have severe headaches that do not go away when you take medicine. You have vision changes. Summary The second trimester is from week 14 through week 27 (months 4 through 6). It is also a time when the fetus is growing rapidly. Your body goes through many changes during pregnancy. The changes vary from woman to woman. Avoid all smoking, herbs, alcohol, and unprescribed drugs. These chemicals affect the formation and growth your baby. Do not use any tobacco products, such as cigarettes, chewing tobacco, and e-cigarettes. If you need help quitting, ask your health care provider. Contact your health care provider if you have any questions. Keep all prenatal visits as told by your health care provider. This is important. This information is not intended to replace advice given to you by your health care provider. Make sure you discuss any questions you have with your health care provider. Document Released: 12/21/2000 Document Revised: 06/04/2015 Document Reviewed: 02/28/2012 [employer] Interactive Patient Education  2017 [employer]  

## 2021-10-22 LAB — INTEGRATED 2
AFP MoM: 1
Alpha-Fetoprotein: 42.5 ng/mL
Crown Rump Length: 68.9 mm
DIA MoM: 0.95
DIA Value: 140 pg/mL
Estriol, Unconjugated: 1.03 ng/mL
Gest. Age on Collection Date: 13.3 weeks
Gestational Age: 19.3 weeks
Maternal Age at EDD: 33.3 yr
Nuchal Translucency (NT): 1.5 mm
Nuchal Translucency MoM: 0.96
Number of Fetuses: 1
PAPP-A MoM: 0.57
PAPP-A Value: 597.6 ng/mL
Test Results:: NEGATIVE
Weight: 184 [lb_av]
Weight: 184 [lb_av]
hCG MoM: 1.78
hCG Value: 37.2 IU/mL
uE3 MoM: 0.59

## 2021-10-25 ENCOUNTER — Encounter: Payer: Self-pay | Admitting: Women's Health

## 2021-10-25 ENCOUNTER — Ambulatory Visit (INDEPENDENT_AMBULATORY_CARE_PROVIDER_SITE_OTHER): Payer: No Typology Code available for payment source

## 2021-10-25 ENCOUNTER — Ambulatory Visit (INDEPENDENT_AMBULATORY_CARE_PROVIDER_SITE_OTHER): Payer: No Typology Code available for payment source | Admitting: Women's Health

## 2021-10-25 VITALS — BP 120/75 | HR 88 | Wt 191.4 lb

## 2021-10-25 DIAGNOSIS — Z23 Encounter for immunization: Secondary | ICD-10-CM

## 2021-10-25 DIAGNOSIS — Z363 Encounter for antenatal screening for malformations: Secondary | ICD-10-CM

## 2021-10-25 DIAGNOSIS — Z3A19 19 weeks gestation of pregnancy: Secondary | ICD-10-CM

## 2021-10-25 DIAGNOSIS — Z3482 Encounter for supervision of other normal pregnancy, second trimester: Secondary | ICD-10-CM

## 2021-10-25 DIAGNOSIS — Z3480 Encounter for supervision of other normal pregnancy, unspecified trimester: Secondary | ICD-10-CM

## 2021-10-25 NOTE — Patient Instructions (Signed)
Shirley Peters, thank you for choosing our office today! We appreciate the opportunity to meet your healthcare needs. You may receive a short survey by mail, e-mail, or through [employer]. If you are happy with your care we would appreciate if you could take just a few minutes to complete the survey questions. We read all of your comments and take your feedback very seriously. Thank you again for choosing our office.  Center for [employer] Team at [place] at [facility] ([place], [place] [address]) Entrance C, located off of [address] parking  Go to [email or web address] to register for FREE online childbirth classes  Call the office [phone]) or go to [facility] if: You begin to severe cramping Your water breaks.  Sometimes it is a big gush of fluid, sometimes it is just a trickle that keeps getting your panties wet or running down your legs You have vaginal bleeding.  It is normal to have a small amount of spotting if your cervix was checked.   [facility] Pediatricians/Family Doctors [facility]): [address] [place], [facility]: [address] [place], [phone]                [place]): [place], [phone] (call to ask if accepting patients) [facility]: [address], [place], [place] Pediatricians/Family Doctors [facility]): [place]. [place], [address], [place] [facility]: [address] [place], [place] [facility]: [place], [place] [facility]): [phone] [facility]: [address], [place]: [address], [place] [facility]: [phone], [phone]  Home Blood Pressure Monitoring for Patients   Your provider has recommended that you check your blood pressure (BP) at least once a week at home. If you do not have a blood pressure cuff at home, one will be provided for you. Contact your provider if you have not received your monitor within 1 week.   Helpful Tips for Accurate Home Blood Pressure Checks  Don't smoke, exercise, or drink caffeine 30 minutes before checking your BP Use the restroom before checking your BP (a full bladder can raise your pressure) Relax in a comfortable upright chair Feet on the ground Left arm resting comfortably on a flat surface at the level of your heart Legs uncrossed Back supported Sit quietly and don't talk Place the cuff on your bare arm Adjust snuggly, so that only two fingertips can fit between your skin and the top of the cuff Check 2 readings separated by at least one minute Keep a log of your BP readings For a visual, please reference this diagram: http://ccnc.care/bpdiagram  Provider Name: [facility]     Phone: [phone]  Zone 1: ALL CLEAR  Continue to monitor your symptoms:  BP reading is less than 140 (top number) or less than 90 (bottom number)  No right upper stomach pain No headaches or seeing spots No feeling nauseated or throwing up No swelling in face and hands  Zone 2: CAUTION Call your doctor's office for any of the following:  BP reading is greater than 140 (top number) or greater than  90 (bottom number)  Stomach pain under your ribs in the middle or right side Headaches or seeing spots Feeling nauseated or throwing up Swelling in face and hands  Zone 3: EMERGENCY  Seek immediate medical care if you have any of the following:  BP reading is greater than160 (top number) or greater than 110 (bottom number) Severe headaches not improving with Tylenol Serious difficulty catching your breath Any worsening symptoms from  Zone 2     Second Trimester of Pregnancy The second trimester is from week 14 through week 27 (months 4 through 6). The second trimester is often a time when you feel your best. Your body has adjusted to being pregnant, and you begin to feel better physically. Usually, morning sickness has lessened or quit completely, you may have more energy, and you may have an increase in appetite. The second trimester is also a time when the fetus is growing rapidly. At the end of the sixth month, the fetus is about 9 inches long and weighs about 1 pounds. You will likely begin to feel the baby move (quickening) between 16 and 20 weeks of pregnancy. Body changes during your second trimester Your body continues to go through many changes during your second trimester. The changes vary from woman to woman. Your weight will continue to increase. You will notice your lower abdomen bulging out. You may begin to get stretch marks on your hips, abdomen, and breasts. You may develop headaches that can be relieved by medicines. The medicines should be approved by your health care provider. You may urinate more often because the fetus is pressing on your bladder. You may develop or continue to have heartburn as a result of your pregnancy. You may develop constipation because certain hormones are causing the muscles that push waste through your intestines to slow down. You may develop hemorrhoids or swollen, bulging veins (varicose veins). You may have back pain. This is caused by: Weight gain. Pregnancy hormones that are relaxing the joints in your pelvis. A shift in weight and the muscles that support your balance. Your breasts will continue to grow and they will continue to become tender. Your gums may bleed and may be sensitive to brushing and flossing. Dark spots or blotches (chloasma, mask of pregnancy) may develop on your face. This will likely fade after the baby is born. A dark line from your belly button to  the pubic area (linea nigra) may appear. This will likely fade after the baby is born. You may have changes in your hair. These can include thickening of your hair, rapid growth, and changes in texture. Some women also have hair loss during or after pregnancy, or hair that feels dry or thin. Your hair will most likely return to normal after your baby is born.  What to expect at prenatal visits During a routine prenatal visit: You will be weighed to make sure you and the fetus are growing normally. Your blood pressure will be taken. Your abdomen will be measured to track your baby's growth. The fetal heartbeat will be listened to. Any test results from the previous visit will be discussed.  Your health care provider may ask you: How you are feeling. If you are feeling the baby move. If you have had any abnormal symptoms, such as leaking fluid, bleeding, severe headaches, or abdominal cramping. If you are using any tobacco products, including cigarettes, chewing tobacco, and electronic cigarettes. If you have any questions.  Other tests that may be performed during   your second trimester include: Blood tests that check for: Low iron levels (anemia). High blood sugar that affects pregnant women (gestational diabetes) between 24 and 28 weeks. Rh antibodies. This is to check for a protein on red blood cells (Rh factor). Urine tests to check for infections, diabetes, or protein in the urine. An ultrasound to confirm the proper growth and development of the baby. An amniocentesis to check for possible genetic problems. Fetal screens for spina bifida and Down syndrome. HIV (human immunodeficiency virus) testing. Routine prenatal testing includes screening for HIV, unless you choose not to have this test.  Follow these instructions at home: Medicines Follow your health care provider's instructions regarding medicine use. Specific medicines may be either safe or unsafe to take during  pregnancy. Take a prenatal vitamin that contains at least 600 micrograms (mcg) of folic acid. If you develop constipation, try taking a stool softener if your health care provider approves. Eating and drinking Eat a balanced diet that includes fresh fruits and vegetables, whole grains, good sources of protein such as meat, eggs, or tofu, and low-fat dairy. Your health care provider will help you determine the amount of weight gain that is right for you. Avoid raw meat and uncooked cheese. These carry germs that can cause birth defects in the baby. If you have low calcium intake from food, talk to your health care provider about whether you should take a daily calcium supplement. Limit foods that are high in fat and processed sugars, such as fried and sweet foods. To prevent constipation: Drink enough fluid to keep your urine clear or pale yellow. Eat foods that are high in fiber, such as fresh fruits and vegetables, whole grains, and beans. Activity Exercise only as directed by your health care provider. Most women can continue their usual exercise routine during pregnancy. Try to exercise for 30 minutes at least 5 days a week. Stop exercising if you experience uterine contractions. Avoid heavy lifting, wear low heel shoes, and practice good posture. A sexual relationship may be continued unless your health care provider directs you otherwise. Relieving pain and discomfort Wear a good support bra to prevent discomfort from breast tenderness. Take warm sitz baths to soothe any pain or discomfort caused by hemorrhoids. Use hemorrhoid cream if your health care provider approves. Rest with your legs elevated if you have leg cramps or low back pain. If you develop varicose veins, wear support hose. Elevate your feet for 15 minutes, 3-4 times a day. Limit salt in your diet. Prenatal Care Write down your questions. Take them to your prenatal visits. Keep all your prenatal visits as told by your health  care provider. This is important. Safety Wear your seat belt at all times when driving. Make a list of emergency phone numbers, including numbers for family, friends, the hospital, and police and fire departments. General instructions Ask your health care provider for a referral to a local prenatal education class. Begin classes no later than the beginning of month 6 of your pregnancy. Ask for help if you have counseling or nutritional needs during pregnancy. Your health care provider can offer advice or refer you to specialists for help with various needs. Do not use hot tubs, steam rooms, or saunas. Do not douche or use tampons or scented sanitary pads. Do not cross your legs for long periods of time. Avoid cat litter boxes and soil used by cats. These carry germs that can cause birth defects in the baby and possibly loss of the   fetus by miscarriage or stillbirth. Avoid all smoking, herbs, alcohol, and unprescribed drugs. Chemicals in these products can affect the formation and growth of the baby. Do not use any products that contain nicotine or tobacco, such as cigarettes and e-cigarettes. If you need help quitting, ask your health care provider. Visit your dentist if you have not gone yet during your pregnancy. Use a soft toothbrush to brush your teeth and be gentle when you floss. Contact a health care provider if: You have dizziness. You have mild pelvic cramps, pelvic pressure, or nagging pain in the abdominal area. You have persistent nausea, vomiting, or diarrhea. You have a bad smelling vaginal discharge. You have pain when you urinate. Get help right away if: You have a fever. You are leaking fluid from your vagina. You have spotting or bleeding from your vagina. You have severe abdominal cramping or pain. You have rapid weight gain or weight loss. You have shortness of breath with chest pain. You notice sudden or extreme swelling of your face, hands, ankles, feet, or legs. You  have not felt your baby move in over an hour. You have severe headaches that do not go away when you take medicine. You have vision changes. Summary The second trimester is from week 14 through week 27 (months 4 through 6). It is also a time when the fetus is growing rapidly. Your body goes through many changes during pregnancy. The changes vary from woman to woman. Avoid all smoking, herbs, alcohol, and unprescribed drugs. These chemicals affect the formation and growth your baby. Do not use any tobacco products, such as cigarettes, chewing tobacco, and e-cigarettes. If you need help quitting, ask your health care provider. Contact your health care provider if you have any questions. Keep all prenatal visits as told by your health care provider. This is important. This information is not intended to replace advice given to you by your health care provider. Make sure you discuss any questions you have with your health care provider. Document Released: 12/21/2000 Document Revised: 06/04/2015 Document Reviewed: 02/28/2012 Elsevier Interactive Patient Education  2017 Elsevier Inc.  

## 2021-10-25 NOTE — Progress Notes (Signed)
LOW-RISK PREGNANCY VISIT Patient name: Shirley Peters MRN [id number]  Date of birth: 24-Aug-1990 Chief Complaint:   Routine Prenatal Visit  History of Present Illness:   Shirley Peters is a 31 y.o. G14P1001 female at [redacted]w[redacted]d with an Estimated Date of Delivery: 03/18/22 being seen today for ongoing management of a low-risk pregnancy.   Today she reports pelvic bone pain. Has not taken lovenox in almost 3wks d/t pain, husband give it in arm (has some nerve damage there from spinal surgery, so needle doesn't hurt- just medicine going in). Still having tachycardia/palpitations on metoprolol, has appt w/ Dr. Servando Salina on 10/20. Contractions: Not present. Vag. Bleeding: None.  Movement: Present. denies leaking of fluid.     09/06/2021   10:10 AM 03/22/2021    3:01 PM 01/21/2020    8:23 AM 04/10/2019    9:25 AM  Depression screen PHQ 2/9  Decreased Interest 0 0 0 0  Down, Depressed, Hopeless 0 0 0 0  PHQ - 2 Score 0 0 0 0  Altered sleeping 2 1  1   Tired, decreased energy 2 1  1   Change in appetite 0 0  0  Feeling bad or failure about yourself  0 0  0  Trouble concentrating 0 0  0  Moving slowly or fidgety/restless 0 0  0  Suicidal thoughts 0 0  0  PHQ-9 Score 4 2  2   Difficult doing work/chores    Not difficult at all        09/06/2021   10:10 AM 03/22/2021    3:01 PM 04/10/2019    9:26 AM  GAD 7 : Generalized Anxiety Score  Nervous, Anxious, on Edge 0 0 0  Control/stop worrying 0 0 0  Worry too much - different things 0 1 0  Trouble relaxing 0 1 0  Restless 0 0 0  Easily annoyed or irritable 1 1 1   Afraid - awful might happen 0 0 0  Total GAD 7 Score 1 3 1   Anxiety Difficulty   Not difficult at all      Review of Systems:   Pertinent items are noted in HPI Denies abnormal vaginal discharge w/ itching/odor/irritation, headaches, visual changes, shortness of breath, chest pain, abdominal pain, severe nausea/vomiting, or problems with urination or bowel movements unless otherwise stated  above. Pertinent History Reviewed:  Reviewed past medical,surgical, social, obstetrical and family history.  Reviewed problem list, medications and allergies. Physical Assessment:   Vitals:   10/25/21 1428  BP: 120/75  Pulse: 88  Weight: 191 lb 6.4 oz (86.8 kg)  Body mass index is 29.98 kg/m.        Physical Examination:   General appearance: Well appearing, and in no distress  Mental status: Alert, oriented to person, place, and time  Skin: Warm & dry  Cardiovascular: Normal heart rate noted  Respiratory: Normal respiratory effort, no distress  Abdomen: Soft, gravid, nontender  Pelvic: Cervical exam deferred         Extremities: Edema: None  Fetal Status:     Movement: Present  03/24/2021 19+3 wks,breech,anterior placenta gr 0,normal ovaries,cx 3.3 cm,FHR 154 bpm,svp of fluid 4.7 cm,LVEICF,EFW 349 g 90%,anatomy complete  Chaperone: N/A   No results found for this or any previous visit (from the past 24 hour(s)).  Assessment & Plan:  1) Low-risk pregnancy G2P1001 at [redacted]w[redacted]d with an Estimated Date of Delivery: 03/18/22   2) H/O PE, stopped taking Lovenox almost 3wks ago d/t pain w/ medicine going in (  not needle itself), try ice prior to injection, discuss further w/ Dr. Harriet Masson on 10/20  3) Tachycardia/palpitations> currently on metoprolol 50mg , still having issues, sees Dr. Harriet Masson 10/20. Had discussed potential FGR w/ metoprolol at last visit, today's EFW 90%  4) Fetal isolated EICF> NIPS low-risk, discussed and gave printed info, no further f/u needed   Meds: No orders of the defined types were placed in this encounter.  Labs/procedures today: U/S, flu shot  Plan:  Continue routine obstetrical care  Next visit: prefers in person    Reviewed: Preterm labor symptoms and general obstetric precautions including but not limited to vaginal bleeding, contractions, leaking of fluid and fetal movement were reviewed in detail with the patient.  All questions were answered. Does have home bp cuff.  Office bp cuff given: not applicable. Check bp weekly, let us know if consistently >140 and/or >90.  Follow-up: No follow-ups on file.  Future Appointments  Date Time Provider [place]  10/29/2021  3:40 PM Berniece Salines, DO [facility] None  03/24/2022 11:00 AM Satira Sark, MD [facility] [place] [facility]    No orders of the defined types were placed in this encounter.  [place], [facility] 10/25/2021 3:06 PM

## 2021-10-25 NOTE — Progress Notes (Signed)
[place] 19+3 wks,breech,anterior placenta gr 0,normal ovaries,cx 3.3 cm,FHR 154 bpm,svp of fluid 4.7 cm,LVEICF,EFW 349 g 90%,anatomy complete

## 2021-10-29 ENCOUNTER — Encounter: Payer: Self-pay | Admitting: Cardiology

## 2021-10-29 ENCOUNTER — Ambulatory Visit (INDEPENDENT_AMBULATORY_CARE_PROVIDER_SITE_OTHER): Payer: No Typology Code available for payment source | Admitting: Cardiology

## 2021-10-29 VITALS — BP 128/70 | HR 94 | Ht 67.0 in | Wt 193.2 lb

## 2021-10-29 DIAGNOSIS — R0602 Shortness of breath: Secondary | ICD-10-CM | POA: Diagnosis not present

## 2021-10-29 DIAGNOSIS — R002 Palpitations: Secondary | ICD-10-CM | POA: Diagnosis not present

## 2021-10-29 NOTE — Progress Notes (Signed)
Cardio-Obstetrics Clinic  New Evaluation  Date:  10/29/2021   ID:  Shirley Peters, DOB 1990/04/15, MRN [id number]  PCP:  Elfredia Nevins, MD   [facility] Providers Cardiologist:  Nona Dell, MD  Electrophysiologist:  None   { Click to update primary MD,subspecialty MD or APP then REFRESH:1}    Referring MD: Elfredia Nevins, MD   Chief Complaint: ***  History of Present Illness:    Shirley Peters is a 31 y.o. female [G2P1001] who is being seen today for the evaluation of *** at the request of Elfredia Nevins, MD.    Prior CV Studies Reviewed: The following studies were reviewed today: ***  Past Medical History:  Diagnosis Date   Pulmonary embolus [facility])    November 2021   Scoliosis     Past Surgical History:  Procedure Laterality Date   BACK SURGERY     age 20   WISDOM TOOTH EXTRACTION  Jan 2014   { Click here to update PMH, PSH, OB Hx then refresh note  :1}   OB History     Gravida  2   Para  1   Term  1   Preterm      AB      Living  1      SAB      IAB      Ectopic      Multiple      Live Births  1           { Click here to update OB Charting then refresh note  :1}    Current Medications: Current Meds  Medication Sig   metoprolol succinate (TOPROL-XL) 50 MG 24 hr tablet Take 1 tablet (50 mg) by mouth once daily   Prenatal Vit-Fe Fumarate-FA (PRENATAL VITAMINS) 28-0.8 MG TABS Take by mouth.     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  Tobacco Use   Smoking status: Former    Types: E-cigarettes    Quit date: 11/11/2019    Years since quitting: 1.9   Smokeless tobacco: Never  Vaping Use   Vaping Use: Former  Substance and Sexual Activity   Alcohol use: Not Currently     Comment: occ   Drug use: No   Sexual activity: Yes    Birth control/protection: None  Other Topics Concern   Not on file  Social History Narrative   Son turned 4 on Feb 5         Social Determinants of Health   Financial Resource Strain: Low Risk  (03/22/2021)   Overall Financial Resource Strain (CARDIA)    Difficulty of Paying Living Expenses: Not hard at all  Food Insecurity: No Food Insecurity (03/22/2021)   Hunger Vital Sign    Worried About Running Out of Food in the Last Year: Never true    Ran Out of Food in the Last Year: Never true  Transportation Needs: No Transportation Needs (03/22/2021)   PRAPARE - Administrator, Civil Service (Medical): No    Lack of Transportation (Non-Medical): No  Physical Activity: Inactive (03/22/2021)   Exercise Vital Sign    Days of Exercise per Week: 0 days    Minutes  of Exercise per Session: 0 min  Stress: No Stress Concern Present (03/22/2021)   [place]    Feeling of Stress : Only a little  Social Connections: Moderately Isolated (03/22/2021)   Social Connection and Isolation Panel [NHANES]    Frequency of Communication with Friends and Family: More than three times a week    Frequency of Social Gatherings with Friends and Family: Twice a week    Attends Religious Services: Never    Marine scientist or Organizations: No    Attends Music therapist: Never    Marital Status: Living with partner  { Click here to update SDOH then refresh :1}    Family History  Problem Relation Age of Onset   Pulmonary embolism Mother    Parkinson's disease Father    Diabetes Father    Cancer Father        prostate   Hypertension Maternal Grandmother    Diabetes Maternal Grandmother    Kidney cancer Maternal Grandmother    Alcohol abuse Maternal Grandfather    Hypertension Paternal Grandmother    Breast cancer Paternal Grandmother    Diabetes  Paternal Grandmother    Alcohol abuse Paternal Grandfather    Colon cancer Neg Hx    { Click here to update FH then refresh note    :1}   ROS:   Please see the history of present illness.    *** All other systems reviewed and are negative.   Labs/EKG Reviewed:    EKG:   EKG is *** ordered today.  The ekg ordered today demonstrates ***  Recent Labs: 09/08/2021: Hemoglobin 12.7; Platelets 233   Recent Lipid Panel No results found for: "CHOL", "TRIG", "HDL", "CHOLHDL", "LDLCALC", "LDLDIRECT"  Physical Exam:    VS:  BP 128/70   Pulse 94   Ht 5\' 7"  (1.702 m)   Wt 193 lb 3.2 oz (87.6 kg)   LMP 06/11/2021   SpO2 96%   BMI 30.26 kg/m     Wt Readings from Last 3 Encounters:  10/29/21 193 lb 3.2 oz (87.6 kg)  10/25/21 191 lb 6.4 oz (86.8 kg)  10/04/21 186 lb 3.2 oz (84.5 kg)     GEN: *** Well nourished, well developed in no acute distress HEENT: Normal NECK: No JVD; No carotid bruits LYMPHATICS: No lymphadenopathy CARDIAC: ***RRR, no murmurs, rubs, gallops RESPIRATORY:  Clear to auscultation without rales, wheezing or rhonchi  ABDOMEN: Soft, non-tender, non-distended MUSCULOSKELETAL:  No edema; No deformity  SKIN: Warm and dry NEUROLOGIC:  Alert and oriented x 3 PSYCHIATRIC:  Normal affect    Risk Assessment/Risk Calculators:   { Click to calculate CARPREG II - THEN refresh note :1}    { Click to caclulate Mod WHO Class of CV Risk - THEN refresh note :1}     { Click for [id number] Score - THEN Refresh Note    :[id number]}      ASSESSMENT & PLAN:    *** There are no Patient Instructions on file for this visit.   Dispo:  No follow-ups on file.   Medication Adjustments/Labs and Tests Ordered: Current medicines are reviewed at length with the patient today.  Concerns regarding medicines are outlined above.  Tests Ordered: No orders of the defined types were placed in this encounter.  Medication Changes: No orders of the defined types were placed in this  encounter.

## 2021-10-29 NOTE — Patient Instructions (Signed)
Medication Instructions:  Your physician recommends that you continue on your current medications as directed. Please refer to the Current Medication list given to you today.  *If you need a refill on your cardiac medications before your next appointment, please call your pharmacy*   Lab Work: NONE If you have labs (blood work) drawn today and your tests are completely normal, you will receive your results only by: MyChart Message (if you have MyChart) OR A paper copy in the mail If you have any lab test that is abnormal or we need to change your treatment, we will call you to review the results.   Testing/Procedures: Your physician has requested that you have an echocardiogram. Echocardiography is a painless test that uses sound waves to create images of your heart. It provides your doctor with information about the size and shape of your heart and how well your heart's chambers and valves are working. This procedure takes approximately one hour. There are no restrictions for this procedure. Please do NOT wear cologne, perfume, aftershave, or lotions (deodorant is allowed). Please arrive 15 minutes prior to your appointment time.  ZIO XT- Long Term Monitor Instructions  Your physician has requested you wear a ZIO patch monitor for 7 days.  This is a single patch monitor. [employer] supplies one patch monitor per enrollment. Additional stickers are not available. Please do not apply patch if you will be having a Nuclear Stress Test,  Echocardiogram, Cardiac CT, MRI, or Chest Xray during the period you would be wearing the  monitor. The patch cannot be worn during these tests. You cannot remove and re-apply the  ZIO XT patch monitor.  Your ZIO patch monitor will be mailed 3 day USPS to your address on file. It may take 3-5 days  to receive your monitor after you have been enrolled.  Once you have received your monitor, please review the enclosed instructions. Your monitor  has already been  registered assigning a specific monitor serial # to you.  Billing and Patient Assistance Program Information  We have supplied [employer] with any of your insurance information on file for billing purposes. [employer] offers a sliding scale Patient Assistance Program for patients that do not have  insurance, or whose insurance does not completely cover the cost of the ZIO monitor.  You must apply for the Patient Assistance Program to qualify for this discounted rate.  To apply, please call [employer] at [phone], select option 4, select option 2, ask to apply for  Patient Assistance Program. [employer] will ask your household income, and how many people  are in your household. They will quote your out-of-pocket cost based on that information.  [employer] will also be able to set up a 12-month, interest-free payment plan if needed.  Applying the monitor   Shave hair from upper left chest.  Hold abrader disc by orange tab. Rub abrader in 40 strokes over the upper left chest as  indicated in your monitor instructions.  Clean area with 4 enclosed alcohol pads. Let dry.  Apply patch as indicated in monitor instructions. Patch will be placed under collarbone on left  side of chest with arrow pointing upward.  Rub patch adhesive wings for 2 minutes. Remove white label marked "1". Remove the white  label marked "2". Rub patch adhesive wings for 2 additional minutes.  While looking in a mirror, press and release button in center of patch. A small green light will  flash 3-4 times. This will be your only indicator that   the monitor has been turned on.  Do not shower for the first 24 hours. You may shower after the first 24 hours.  Press the button if you feel a symptom. You will hear a small click. Record Date, Time and  Symptom in the Patient Logbook.  When you are ready to remove the patch, follow instructions on the last 2 pages of Patient  Logbook. Stick patch monitor onto the last page of Patient  Logbook.  Place Patient Logbook in the blue and white box. Use locking tab on box and tape box closed  securely. The blue and white box has prepaid postage on it. Please place it in the mailbox as  soon as possible. Your physician should have your test results approximately 7 days after the  monitor has been mailed back to [employer].  Call [employer] Customer Care at [phone] if you have questions regarding  your ZIO XT patch monitor. Call them immediately if you see an orange light blinking on your  monitor.  If your monitor falls off in less than 4 days, contact our Monitor department at [phone].  If your monitor becomes loose or falls off after 4 days call [employer] at [phone] for  suggestions on securing your monitor     Follow-Up: At [facility], you and your health needs are our priority.  As part of our continuing mission to provide you with exceptional heart care, we have created designated Provider Care Teams.  These Care Teams include your primary Cardiologist (physician) and Advanced Practice Providers (APPs -  Physician Assistants and Nurse Practitioners) who all work together to provide you with the care you need, when you need it.  We recommend signing up for the patient portal called "MyChart".  Sign up information is provided on this After Visit Summary.  MyChart is used to connect with patients for Virtual Visits (Telemedicine).  Patients are able to view lab/test results, encounter notes, upcoming appointments, etc.  Non-urgent messages can be sent to your provider as well.   To learn more about what you can do with MyChart, go to [email or web address].    Your next appointment:   14 week(s)  The format for your next appointment:   In Person  Provider:   Kardie Tobb, DO [address] [place] [address] 

## 2021-10-29 NOTE — Progress Notes (Unsigned)
Cardio-Obstetrics Clinic  New Evaluation  Date:  10/29/2021   ID:  Shirley Peters, DOB 1990-08-21, MRN [id number]  PCP:  Redmond School, [facility] Cardiologist:  Rozann Lesches, MD  Electrophysiologist:  None   { Click to update primary MD,subspecialty MD or APP then REFRESH:1}    Referring MD: Redmond School, MD   Chief Complaint: " I am ok"  History of Present Illness:    Shirley Peters is a 31 y.o. female [G2P1001] who is being seen today for the evaluation of *** at the request of Redmond School, MD.   Medical hx of pulmonary embolism   Worsening shortness of breath - palpiations  \did not start lovenox - needs to   Prior CV Studies Reviewed: The following studies were reviewed today: ***  Past Medical History:  Diagnosis Date   Pulmonary embolus [facility])    November 2021   Scoliosis     Past Surgical History:  Procedure Laterality Date   BACK SURGERY     age 69   WISDOM TOOTH EXTRACTION  Jan 1287   { Click here to update PMH, PSH, OB Hx then refresh note  :1}   OB History     Gravida  2   Para  1   Term  1   Preterm      AB      Living  1      SAB      IAB      Ectopic      Multiple      Live Births  1           { Click here to update OB Charting then refresh note  :1}    Current Medications: Current Meds  Medication Sig   metoprolol succinate (TOPROL-XL) 50 MG 24 hr tablet Take 1 tablet (50 mg) by mouth once daily   Prenatal Vit-Fe Fumarate-FA (PRENATAL VITAMINS) 28-0.8 MG TABS Take by mouth.     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  Tobacco Use   Smoking status: Former    Types: E-cigarettes    Quit date: 11/11/2019    Years since quitting: 1.9    Smokeless tobacco: Never  Vaping Use   Vaping Use: Former  Substance and Sexual Activity   Alcohol use: Not Currently    Comment: occ   Drug use: No   Sexual activity: Yes    Birth control/protection: None  Other Topics Concern   Not on file  Social History Narrative   Son turned 4 on Feb 5         Social Determinants of Health   Financial Resource Strain: Low Risk  (03/22/2021)   Overall Financial Resource Strain (CARDIA)    Difficulty of Paying Living Expenses: Not hard at all  Food Insecurity: No [place] (03/22/2021)   Hunger Vital Sign    Worried About Running Out of Food in the Last Year: Never true    Ethel in the Last Year: Never true  Transportation Needs: No Transportation Needs (03/22/2021)   PRAPARE - Hydrologist (Medical): No    Lack of Transportation (Non-Medical):  No  Physical Activity: Inactive (03/22/2021)   Exercise Vital Sign    Days of Exercise per Week: 0 days    Minutes of Exercise per Session: 0 min  Stress: No Stress Concern Present (03/22/2021)   [employer] - Occupational Stress Questionnaire    Feeling of Stress : Only a little  Social Connections: Moderately Isolated (03/22/2021)   Social Connection and Isolation Panel [NHANES]    Frequency of Communication with Friends and Family: More than three times a week    Frequency of Social Gatherings with Friends and Family: Twice a week    Attends Religious Services: Never    Database administratorActive Member of Clubs or Organizations: No    Attends Engineer, structuralClub or Organization Meetings: Never    Marital Status: Living with partner  { Click here to update SDOH then refresh :1}    Family History  Problem Relation Age of Onset   Pulmonary embolism Mother    Parkinson's disease Father    Diabetes Father    Cancer Father        prostate   Hypertension Maternal Grandmother    Diabetes Maternal Grandmother    Kidney cancer Maternal Grandmother     Alcohol abuse Maternal Grandfather    Hypertension Paternal Grandmother    Breast cancer Paternal Grandmother    Diabetes Paternal Grandmother    Alcohol abuse Paternal Grandfather    Colon cancer Neg Hx    { Click here to update FH then refresh note    :1}   ROS:   Please see the history of present illness.    *** All other systems reviewed and are negative.   Labs/EKG Reviewed:    EKG:   EKG is *** ordered today.  The ekg ordered today demonstrates ***  Recent Labs: 09/08/2021: Hemoglobin 12.7; Platelets 233   Recent Lipid Panel No results found for: "CHOL", "TRIG", "HDL", "CHOLHDL", "LDLCALC", "LDLDIRECT"  Physical Exam:    VS:  BP 128/70   Pulse 94   Ht 5\' 7"  (1.702 m)   Wt 193 lb 3.2 oz (87.6 kg)   LMP 06/11/2021   SpO2 96%   BMI 30.26 kg/m     Wt Readings from Last 3 Encounters:  10/29/21 193 lb 3.2 oz (87.6 kg)  10/25/21 191 lb 6.4 oz (86.8 kg)  10/04/21 186 lb 3.2 oz (84.5 kg)     GEN: *** Well nourished, well developed in no acute distress HEENT: Normal NECK: No JVD; No carotid bruits LYMPHATICS: No lymphadenopathy CARDIAC: ***RRR, no murmurs, rubs, gallops RESPIRATORY:  Clear to auscultation without rales, wheezing or rhonchi  ABDOMEN: Soft, non-tender, non-distended MUSCULOSKELETAL:  No edema; No deformity  SKIN: Warm and dry NEUROLOGIC:  Alert and oriented x 3 PSYCHIATRIC:  Normal affect    Risk Assessment/Risk Calculators:   { Click to calculate CARPREG II - THEN refresh note :1}    { Click to caclulate Mod WHO Class of CV Risk - THEN refresh note :1}     { Click for CHADS2VASc Score - THEN Refresh Note    :[id number]}      ASSESSMENT & PLAN:    Monitor  and echo Will discuss with ob team - enocuraged to start lovenox  There are no Patient Instructions on file for this visit.   Dispo:  No follow-ups on file.   Medication Adjustments/Labs and Tests Ordered: Current medicines are reviewed at length with the patient today.   Concerns regarding medicines are outlined above.  Tests  Ordered: No orders of the defined types were placed in this encounter.  Medication Changes: No orders of the defined types were placed in this encounter.

## 2021-11-01 ENCOUNTER — Ambulatory Visit: Payer: No Typology Code available for payment source | Attending: Cardiology

## 2021-11-01 DIAGNOSIS — R002 Palpitations: Secondary | ICD-10-CM

## 2021-11-01 NOTE — Progress Notes (Unsigned)
Enrolled for [employer] to mail a ZIO XT long term holter monitor to the patients address on file.  

## 2021-11-02 ENCOUNTER — Other Ambulatory Visit (HOSPITAL_COMMUNITY): Payer: Self-pay

## 2021-11-02 MED ORDER — METOPROLOL SUCCINATE ER 50 MG PO TB24
50.0000 mg | ORAL_TABLET | Freq: Every day | ORAL | 0 refills | Status: DC
  Filled 2021-11-02: qty 30, 30d supply, fill #0

## 2021-11-06 DIAGNOSIS — R002 Palpitations: Secondary | ICD-10-CM | POA: Diagnosis not present

## 2021-11-16 ENCOUNTER — Ambulatory Visit (HOSPITAL_COMMUNITY): Payer: No Typology Code available for payment source | Attending: Cardiology

## 2021-11-16 DIAGNOSIS — R0602 Shortness of breath: Secondary | ICD-10-CM | POA: Diagnosis present

## 2021-11-16 LAB — ECHOCARDIOGRAM COMPLETE
Area-P 1/2: 4.35 cm2
S' Lateral: 2.6 cm

## 2021-11-18 ENCOUNTER — Other Ambulatory Visit (HOSPITAL_COMMUNITY): Payer: Self-pay

## 2021-11-19 ENCOUNTER — Other Ambulatory Visit (HOSPITAL_COMMUNITY): Payer: Self-pay

## 2021-11-19 ENCOUNTER — Encounter (HOSPITAL_COMMUNITY): Payer: Self-pay

## 2021-11-22 ENCOUNTER — Ambulatory Visit (INDEPENDENT_AMBULATORY_CARE_PROVIDER_SITE_OTHER): Payer: No Typology Code available for payment source | Admitting: Women's Health

## 2021-11-22 ENCOUNTER — Encounter: Payer: Self-pay | Admitting: Women's Health

## 2021-11-22 VITALS — BP 97/61 | HR 89 | Wt 197.5 lb

## 2021-11-22 DIAGNOSIS — R000 Tachycardia, unspecified: Secondary | ICD-10-CM

## 2021-11-22 DIAGNOSIS — Z79899 Other long term (current) drug therapy: Secondary | ICD-10-CM

## 2021-11-22 DIAGNOSIS — Z3480 Encounter for supervision of other normal pregnancy, unspecified trimester: Secondary | ICD-10-CM

## 2021-11-22 DIAGNOSIS — Z3A23 23 weeks gestation of pregnancy: Secondary | ICD-10-CM

## 2021-11-22 DIAGNOSIS — Z86711 Personal history of pulmonary embolism: Secondary | ICD-10-CM

## 2021-11-22 DIAGNOSIS — Z3482 Encounter for supervision of other normal pregnancy, second trimester: Secondary | ICD-10-CM

## 2021-11-22 NOTE — Progress Notes (Addendum)
LOW-RISK PREGNANCY VISIT Patient name: Shirley Peters MRN [id number]  Date of birth: 1990/05/08 Chief Complaint:   Routine Prenatal Visit  History of Present Illness:   Shirley Peters is a 31 y.o. G51P1001 female at [redacted]w[redacted]d with an Estimated Date of Delivery: 03/18/22 being seen today for ongoing management of a low-risk pregnancy.   Today she reports  has started taking her lovenox as prescribed, states husband has 'improved on his technique' and is not hurting as bad. Went to [facility] cards, had echo which was normal, wore monitor, turned it in about 2wks ago and hasn't heard from them yet. States she was told they may increase her metoprolol if needed based on results. States they also told her she could take her lasix prn for excessive feet/leg swelling, hasn't taken any yet. . Contractions: Not present. Vag. Bleeding: None.  Movement: Present. denies leaking of fluid.     09/06/2021   10:10 AM 03/22/2021    3:01 PM 01/21/2020    8:23 AM 04/10/2019    9:25 AM  Depression screen PHQ 2/9  Decreased Interest 0 0 0 0  Down, Depressed, Hopeless 0 0 0 0  PHQ - 2 Score 0 0 0 0  Altered sleeping 2 1  1   Tired, decreased energy 2 1  1   Change in appetite 0 0  0  Feeling bad or failure about yourself  0 0  0  Trouble concentrating 0 0  0  Moving slowly or fidgety/restless 0 0  0  Suicidal thoughts 0 0  0  PHQ-9 Score 4 2  2   Difficult doing work/chores    Not difficult at all        09/06/2021   10:10 AM 03/22/2021    3:01 PM 04/10/2019    9:26 AM  GAD 7 : Generalized Anxiety Score  Nervous, Anxious, on Edge 0 0 0  Control/stop worrying 0 0 0  Worry too much - different things 0 1 0  Trouble relaxing 0 1 0  Restless 0 0 0  Easily annoyed or irritable 1 1 1   Afraid - awful might happen 0 0 0  Total GAD 7 Score 1 3 1   Anxiety Difficulty   Not difficult at all      Review of Systems:   Pertinent items are noted in HPI Denies abnormal vaginal discharge w/ itching/odor/irritation, headaches,  visual changes, shortness of breath, chest pain, abdominal pain, severe nausea/vomiting, or problems with urination or bowel movements unless otherwise stated above. Pertinent History Reviewed:  Reviewed past medical,surgical, social, obstetrical and family history.  Reviewed problem list, medications and allergies. Physical Assessment:   Vitals:   11/22/21 1556  BP: 97/61  Pulse: 89  Weight: 197 lb 8 oz (89.6 kg)  Body mass index is 30.93 kg/m.        Physical Examination:   General appearance: Well appearing, and in no distress  Mental status: Alert, oriented to person, place, and time  Skin: Warm & dry  Cardiovascular: Normal heart rate noted  Respiratory: Normal respiratory effort, no distress  Abdomen: Soft, gravid, nontender  Pelvic: Cervical exam deferred         Extremities:    Fetal Status: Fetal Heart Rate (bpm): 150 Fundal Height: 25 cm Movement: Present    Chaperone: N/A   No results found for this or any previous visit (from the past 24 hour(s)).  Assessment & Plan:  1) Low-risk pregnancy G2P1001 at [redacted]w[redacted]d with an Estimated Date  of Delivery: 03/18/22   2) H/O PE, currently taking lovenox 40mg  as rx'd (had stopped for few weeks d/t pain w/ injection/medicine going in). Recommended alternating injection sites (currently only doing in Rt upper arm.  3) Tachycardia/palpitations> currently on metoprolol 50mg  daily, saw OB cards 10/20, had normal echo 11/7, wore cardiac monitor and just turned in about 2wks ago and hasn't heard back. Recommended to send note if doesn't hear back this week.    Meds: No orders of the defined types were placed in this encounter.  Labs/procedures today: none  Plan:  Continue routine obstetrical care  Next visit: prefers will be in person for pn2, efw u/s (d/t being on metoprolol)     Reviewed: Preterm labor symptoms and general obstetric precautions including but not limited to vaginal bleeding, contractions, leaking of fluid and fetal  movement were reviewed in detail with the patient.  All questions were answered. Does have home bp cuff. Office bp cuff given: not applicable. Check bp weekly, let us know if consistently >140 and/or >90.  Follow-up: Return in about 4 weeks (around 12/20/2021) for [place], US:EFW, in person, MD.  Future Appointments  Date Time Provider [place]  12/20/2021 10:45 AM [facility] [place] [facility] None  12/20/2021 11:30 AM Christin Fudge, CNM [facility]  02/08/2022  9:20 AM Tobb, Godfrey Pick, DO [facility] None  03/24/2022 11:00 AM Satira Sark, MD [facility]    Orders Placed This Encounter  Procedures   US OB Follow Up   [place], [facility] 11/22/2021 4:37 PM

## 2021-11-22 NOTE — Patient Instructions (Signed)
Shirley Peters, thank you for choosing our office today! We appreciate the opportunity to meet your healthcare needs. You may receive a short survey by mail, e-mail, or through [employer]. If you are happy with your care we would appreciate if you could take just a few minutes to complete the survey questions. We read all of your comments and take your feedback very seriously. Thank you again for choosing our office.  Center for [employer] Team at [facility]  [facility] at [facility] ([address] [place], [place] [address]) Entrance C, located off of [address] Free 24/7 valet parking   You will have your sugar test next visit.  Please do not eat or drink anything after midnight the night before you come, not even water.  You will be here for at least two hours.  Please make an appointment online for the bloodwork at [email or web address] for 8:00am (or as close to this as possible). Make sure you select the [facility] service center.   CLASSES: Go to [email or web address] to register for classes (childbirth, breastfeeding, waterbirth, infant CPR, daddy bootcamp, etc.)  Call the office [phone]) or go to [facility] if: You begin to have strong, frequent contractions Your water breaks.  Sometimes it is a big gush of fluid, sometimes it is just a trickle that keeps getting your panties wet or running down your legs You have vaginal bleeding.  It is normal to have a small amount of spotting if your cervix was checked.  You don't feel your baby moving like normal.  If you don't, get you something to eat and drink and lay down and focus on feeling your baby move.   If your baby is still not moving like normal, you should call the office or go to [facility].  Call the office [phone]) or go to [facility] hospital for these signs of pre-eclampsia: Severe headache that does not go away with Tylenol Visual changes- seeing spots, double, blurred vision Pain under your right breast or upper  abdomen that does not go away with Tums or heartburn medicine Nausea and/or vomiting Severe swelling in your hands, feet, and face    [facility] Pediatricians/Family Doctors [facility]): [address] Colette Ribas, [phone]           [facility]: [address], [phone]                [facility]): [address], [phone] (call to ask if accepting patients) [facility] Department: [address] [place], [phone]    [facility] Pediatricians/Family Doctors [facility]): [address], [phone] [facility]: [address] [place], [phone] [facility] [place]: [address], [phone]  [facility] Doctors  [facility]): [phone] [facility]: [address], [phone]   [facility] Doctors [facility]: [address] [phone]  [facility] Doctors  [facility]: [phone], [phone]  Home Blood Pressure Monitoring for Patients   Your provider has recommended that you check your blood pressure (BP) at least once a week at home. If you do not have a blood pressure cuff at home, one will be provided for you. Contact your provider if you have not received your monitor within 1 week.   Helpful Tips for Accurate Home Blood Pressure Checks  Don't smoke, exercise, or drink  caffeine 30 minutes before checking your BP Use the restroom before checking your BP (a full bladder can raise your pressure) Relax in a comfortable upright chair Feet on the ground Left arm resting comfortably on a flat surface at the level of your heart Legs uncrossed Back supported Sit quietly and don't talk Place the cuff on your bare arm Adjust snuggly, so that only two fingertips can fit between your skin and the top of the cuff Check 2  readings separated by at least one minute Keep a log of your BP readings For a visual, please reference this diagram: http://ccnc.care/bpdiagram  Provider Name: [facility]     Phone: [phone]  Zone 1: ALL CLEAR  Continue to monitor your symptoms:  BP reading is less than 140 (top number) or less than 90 (bottom number)  No right upper stomach pain No headaches or seeing spots No feeling nauseated or throwing up No swelling in face and hands  Zone 2: CAUTION Call your doctor's office for any of the following:  BP reading is greater than 140 (top number) or greater than 90 (bottom number)  Stomach pain under your ribs in the middle or right side Headaches or seeing spots Feeling nauseated or throwing up Swelling in face and hands  Zone 3: EMERGENCY  Seek immediate medical care if you have any of the following:  BP reading is greater than160 (top number) or greater than 110 (bottom number) Severe headaches not improving with Tylenol Serious difficulty catching your breath Any worsening symptoms from Zone 2   Second Trimester of Pregnancy The second trimester is from week 13 through week 28, months 4 through 6. The second trimester is often a time when you feel your best. Your body has also adjusted to being pregnant, and you begin to feel better physically. Usually, morning sickness has lessened or quit completely, you may have more energy, and you may have an increase in appetite. The second trimester is also a time when the fetus is growing rapidly. At the end of the sixth month, the fetus is about 9 inches long and weighs about 1 pounds. You will likely begin to feel the baby move (quickening) between 18 and 20 weeks of the pregnancy. BODY CHANGES Your body goes through many changes during pregnancy. The changes vary from woman to woman.  Your weight will continue to increase. You will notice your lower abdomen bulging out. You may begin to get stretch marks on your  hips, abdomen, and breasts. You may develop headaches that can be relieved by medicines approved by your health care provider. You may urinate more often because the fetus is pressing on your bladder. You may develop or continue to have heartburn as a result of your pregnancy. You may develop constipation because certain hormones are causing the muscles that push waste through your intestines to slow down. You may develop hemorrhoids or swollen, bulging veins (varicose veins). You may have back pain because of the weight gain and pregnancy hormones relaxing your joints between the bones in your pelvis and as a result of a shift in weight and the muscles that support your balance. Your breasts will continue to grow and be tender. Your gums may bleed and may be sensitive to brushing and flossing. Dark spots or blotches (chloasma, mask of pregnancy) may develop on your face. This will likely fade after the baby is born. A dark line from your belly button to the pubic area (linea nigra) may appear. This  will likely fade after the baby is born. You may have changes in your hair. These can include thickening of your hair, rapid growth, and changes in texture. Some women also have hair loss during or after pregnancy, or hair that feels dry or thin. Your hair will most likely return to normal after your baby is born. WHAT TO EXPECT AT YOUR PRENATAL VISITS During a routine prenatal visit: You will be weighed to make sure you and the fetus are growing normally. Your blood pressure will be taken. Your abdomen will be measured to track your baby's growth. The fetal heartbeat will be listened to. Any test results from the previous visit will be discussed. Your health care provider may ask you: How you are feeling. If you are feeling the baby move. If you have had any abnormal symptoms, such as leaking fluid, bleeding, severe headaches, or abdominal cramping. If you have any questions. Other tests that may  be performed during your second trimester include: Blood tests that check for: Low iron levels (anemia). Gestational diabetes (between 24 and 28 weeks). Rh antibodies. Urine tests to check for infections, diabetes, or protein in the urine. An ultrasound to confirm the proper growth and development of the baby. An amniocentesis to check for possible genetic problems. Fetal screens for spina bifida and Down syndrome. HOME CARE INSTRUCTIONS  Avoid all smoking, herbs, alcohol, and unprescribed drugs. These chemicals affect the formation and growth of the baby. Follow your health care provider's instructions regarding medicine use. There are medicines that are either safe or unsafe to take during pregnancy. Exercise only as directed by your health care provider. Experiencing uterine cramps is a good sign to stop exercising. Continue to eat regular, healthy meals. Wear a good support bra for breast tenderness. Do not use hot tubs, steam rooms, or saunas. Wear your seat belt at all times when driving. Avoid raw meat, uncooked cheese, cat litter boxes, and soil used by cats. These carry germs that can cause birth defects in the baby. Take your prenatal vitamins. Try taking a stool softener (if your health care provider approves) if you develop constipation. Eat more high-fiber foods, such as fresh vegetables or fruit and whole grains. Drink plenty of fluids to keep your urine clear or pale yellow. Take warm sitz baths to soothe any pain or discomfort caused by hemorrhoids. Use hemorrhoid cream if your health care provider approves. If you develop varicose veins, wear support hose. Elevate your feet for 15 minutes, 3-4 times a day. Limit salt in your diet. Avoid heavy lifting, wear low heel shoes, and practice good posture. Rest with your legs elevated if you have leg cramps or low back pain. Visit your dentist if you have not gone yet during your pregnancy. Use a soft toothbrush to brush your teeth  and be gentle when you floss. A sexual relationship may be continued unless your health care provider directs you otherwise. Continue to go to all your prenatal visits as directed by your health care provider. SEEK MEDICAL CARE IF:  You have dizziness. You have mild pelvic cramps, pelvic pressure, or nagging pain in the abdominal area. You have persistent nausea, vomiting, or diarrhea. You have a bad smelling vaginal discharge. You have pain with urination. SEEK IMMEDIATE MEDICAL CARE IF:  You have a fever. You are leaking fluid from your vagina. You have spotting or bleeding from your vagina. You have severe abdominal cramping or pain. You have rapid weight gain or loss. You have shortness of   breath with chest pain. You notice sudden or extreme swelling of your face, hands, ankles, feet, or legs. You have not felt your baby move in over an hour. You have severe headaches that do not go away with medicine. You have vision changes. Document Released: 12/21/2000 Document Revised: 01/01/2013 Document Reviewed: 02/28/2012 [facility] Patient Information 2015 [place], [place]. This information is not intended to replace advice given to you by your health care provider. Make sure you discuss any questions you have with your health care provider.

## 2021-11-22 NOTE — Addendum Note (Signed)
Addended by: Cheral Marker on: 11/22/2021 04:37 PM   Modules accepted: Orders

## 2021-11-29 ENCOUNTER — Encounter: Payer: Self-pay | Admitting: Women's Health

## 2021-12-01 ENCOUNTER — Encounter: Payer: Self-pay | Admitting: Cardiology

## 2021-12-01 ENCOUNTER — Other Ambulatory Visit (HOSPITAL_COMMUNITY): Payer: Self-pay

## 2021-12-01 ENCOUNTER — Ambulatory Visit: Payer: No Typology Code available for payment source | Attending: Cardiology | Admitting: Cardiology

## 2021-12-01 VITALS — BP 127/71 | HR 119 | Ht 67.0 in | Wt 195.0 lb

## 2021-12-01 DIAGNOSIS — I493 Ventricular premature depolarization: Secondary | ICD-10-CM | POA: Diagnosis not present

## 2021-12-01 MED ORDER — METOPROLOL SUCCINATE ER 50 MG PO TB24
ORAL_TABLET | ORAL | 0 refills | Status: DC
  Filled 2021-12-01: qty 30, 30d supply, fill #0

## 2021-12-01 NOTE — Patient Instructions (Signed)
Medication Instructions:  Your physician recommends that you continue on your current medications as directed. Please refer to the Current Medication list given to you today.  *If you need a refill on your cardiac medications before your next appointment, please call your pharmacy*   Lab Work: NONE If you have labs (blood work) drawn today and your tests are completely normal, you will receive your results only by: MyChart Message (if you have MyChart) OR A paper copy in the mail If you have any lab test that is abnormal or we need to change your treatment, we will call you to review the results.   Testing/Procedures: NONE   Follow-Up: At [facility], you and your health needs are our priority.  As part of our continuing mission to provide you with exceptional heart care, we have created designated Provider Care Teams.  These Care Teams include your primary Cardiologist (physician) and Advanced Practice Providers (APPs -  Physician Assistants and Nurse Practitioners) who all work together to provide you with the care you need, when you need it.  We recommend signing up for the patient portal called "MyChart".  Sign up information is provided on this After Visit Summary.  MyChart is used to connect with patients for Virtual Visits (Telemedicine).  Patients are able to view lab/test results, encounter notes, upcoming appointments, etc.  Non-urgent messages can be sent to your provider as well.   To learn more about what you can do with MyChart, go to [email or web address].    Your next appointment:   Please keep upcoming appointment in January.    The format for your next appointment:   In Person  Provider:   Thomasene Ripple, Do

## 2021-12-03 NOTE — Progress Notes (Signed)
Cardio-[facility]stetrics Clinic  New Evaluation  Date:  12/03/2021   ID:  Shirley Peters, DOB April 03, 1990, MRN [id number]  PCP:  Elfredia Nevins, MD   [facility] Providers Cardiologist:  Nona Dell, MD  Electrophysiologist:  None       Referring MD: Elfredia Nevins, MD   Chief Complaint: " I am ok"  History of Present Illness:    Shirley Peters is a 31 y.o. female [G2P1001] who is being seen today for the evaluation of shortness of breath at the request of Elfredia Nevins, MD.   Medical hx of pulmonary embolism 2021 at which time she completed her course of anticoagulant, since becoming pregnant she had been started on Lovenox for hypercoagulable state unfortunately she had not started this yet.  I saw the patient October 29, 2021 at that time she was experiencing palpitations as well as shortness of breath.  I placed a monitor on the patient and got an echocardiogram.  At that time she had not been taking her Lovenox as the patient that it was important for her to start on Lovenox to protect her given her history of pulmonary embolism.  She was agreeable for that.  Since I saw the patient she is taking her Lovenox also she did get her monitor.  Which show evidence of significant frequent PVCs.  Prior CV Studies Reviewed: The following studies were reviewed today: Zio monitor Patch Wear Time:  5 days and 13 hours (2023-10-28T22:50:09-398 to 2023-11-03T11:50:32-0400)   Patient had a min HR of 73 bpm, max HR of 150 bpm, and avg HR of 96 bpm. Predominant underlying rhythm was Sinus Rhythm. 2 Supraventricular Tachycardia runs occurred, the run with the fastest interval lasting 5 beats with a max rate of 150 bpm (avg 135 bpm); the run with the fastest interval was also the longest.    Isolated SVEs were rare (<1.0%), SVE Couplets were rare (<1.0%), and SVE Triplets were rare (<1.0%). Isolated VEs were frequent (10.5%, 80273), VE Couplets were rare (<1.0%, 314), and no VE   Triplets were present. Ventricular Bigeminy and Trigeminy were present.   Symptoms associated with premature ventricular complex.   Conclusion: This study is remarkable for frequent premature ventricular complexes (10.5%, [id number]).    TTE 11/16/2021 IMPRESSIONS   1. Left ventricular ejection fraction, by estimation, is 60 to 65%. The  left ventricle has normal function. The left ventricle has no regional  wall motion abnormalities. Left ventricular diastolic parameters were  normal.   2. Right ventricular systolic function is normal. The right ventricular  size is normal. There is normal pulmonary artery systolic pressure.   3. The mitral valve is normal in structure. No evidence of mitral valve  regurgitation. No evidence of mitral stenosis.   4. The aortic valve is normal in structure. Aortic valve regurgitation is  not visualized. No aortic stenosis is present.   5. The inferior vena cava is normal in size with greater than 50%  respiratory variability, suggesting right atrial pressure of 3 mmHg.   FINDINGS   Left Ventricle: Left ventricular ejection fraction, by estimation, is 60  to 65%. The left ventricle has normal function. The left ventricle has no  regional wall motion abnormalities. The left ventricular internal cavity  size was normal in size. There is   no left ventricular hypertrophy. Left ventricular diastolic parameters  were normal.   Right Ventricle: The right ventricular size is normal. No increase in  right ventricular wall thickness. Right ventricular systolic function is  normal. There is normal pulmonary artery systolic pressure. The tricuspid  regurgitant velocity is 2.34 m/s, and   with an assumed right atrial pressure of 3 mmHg, the estimated right  ventricular systolic pressure is 24.9 mmHg.   Left Atrium: Left atrial size was normal in size.   Right Atrium: Right atrial size was normal in size.   Pericardium: There is no evidence of pericardial  effusion.   Mitral Valve: The mitral valve is normal in structure. No evidence of  mitral valve regurgitation. No evidence of mitral valve stenosis.   Tricuspid Valve: The tricuspid valve is normal in structure. Tricuspid  valve regurgitation is not demonstrated. No evidence of tricuspid  stenosis.   Aortic Valve: The aortic valve is normal in structure. Aortic valve  regurgitation is not visualized. No aortic stenosis is present.   Pulmonic Valve: The pulmonic valve was normal in structure. Pulmonic valve  regurgitation is not visualized. No evidence of pulmonic stenosis.   Aorta: The aortic root is normal in size and structure.   Venous: The inferior vena cava is normal in size with greater than 50%  respiratory variability, suggesting right atrial pressure of 3 mmHg.   IAS/Shunts: No atrial level shunt detected by color flow Doppler.  Transthoracic echocardiogram December 03, 2019 IMPRESSIONS     1. Abnormal septal motion . Left ventricular ejection fraction, by  estimation, is 60 to 65%. The left ventricle has normal function. The left  ventricle has no regional wall motion abnormalities. Left ventricular  diastolic parameters were normal.   2. Right ventricular systolic function is normal. The right ventricular  size is normal.   3. The mitral valve is normal in structure. No evidence of mitral valve  regurgitation. No evidence of mitral stenosis.   4. The aortic valve is normal in structure. Aortic valve regurgitation is  not visualized. No aortic stenosis is present.   5. The inferior vena cava is normal in size with greater than 50%  respiratory variability, suggesting right atrial pressure of 3 mmHg.   FINDINGS   Left Ventricle: Abnormal septal motion. Left ventricular ejection  fraction, by estimation, is 60 to 65%. The left ventricle has normal  function. The left ventricle has no regional wall motion abnormalities.  The left ventricular internal cavity size was   normal in size. There is no left ventricular hypertrophy. Left ventricular  diastolic parameters were normal.   Right Ventricle: The right ventricular size is normal. No increase in  right ventricular wall thickness. Right ventricular systolic function is  normal.   Left Atrium: Left atrial size was normal in size.   Right Atrium: Right atrial size was normal in size.   Pericardium: There is no evidence of pericardial effusion.   Mitral Valve: The mitral valve is normal in structure. No evidence of  mitral valve regurgitation. No evidence of mitral valve stenosis.   Tricuspid Valve: The tricuspid valve is normal in structure. Tricuspid  valve regurgitation is trivial. No evidence of tricuspid stenosis.   Aortic Valve: The aortic valve is normal in structure. Aortic valve  regurgitation is not visualized. No aortic stenosis is present.   Pulmonic Valve: The pulmonic valve was normal in structure. Pulmonic valve  regurgitation is not visualized. No evidence of pulmonic stenosis.   Aorta: The aortic root is normal in size and structure.   Venous: The inferior vena cava is normal in size with greater than 50%  respiratory variability, suggesting right atrial pressure of 3 mmHg.  IAS/Shunts: No atrial level shunt detected by color flow Doppler.   LEFT VENTRICLE   ZIO monitor ZIO XT reviewed.  6 days 3 hours analyzed.  Predominant rhythm is sinus with heart rate ranging from 54 bpm up to 162 bpm and average heart rate 89 bpm.  Rare PACs were noted representing less than 1% total beats.  Rare PVCs and couplets were noted representing less than 1% total beats.  One 4 beat run of NSVT noted.  There were no sustained arrhythmias or pauses.  Past Medical History:  Diagnosis Date   Pulmonary embolus [facility])    November 2021   Scoliosis     Past Surgical History:  Procedure Laterality Date   BACK SURGERY     age 72   WISDOM TOOTH EXTRACTION  Jan 2014      OB History      Gravida  2   Para  1   Term  1   Preterm      AB      Living  1      SAB      IAB      Ectopic      Multiple      Live Births  1               Current Medications: Current Meds  Medication Sig   enoxaparin (LOVENOX) 40 MG/0.4ML injection Inject 0.4 mLs (40 mg total) into the skin daily.   metoprolol succinate (TOPROL-XL) 50 MG 24 hr tablet Take 1 tablet (50 mg total) by mouth daily. --call office to set up a wellness for refills   Prenatal Vit-Fe Fumarate-FA (PRENATAL VITAMINS) 28-0.8 MG TABS Take by mouth.     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  Tobacco Use   Smoking status: Former    Types: E-cigarettes    Quit date: 11/11/2019    Years since quitting: 2.0   Smokeless tobacco: Never  Vaping Use   Vaping Use: Former  Substance and Sexual Activity   Alcohol use: Not Currently    Comment: occ   Drug use: No   Sexual activity: Yes    Birth control/protection: None  Other Topics Concern   Not on file  Social History Narrative   Son turned 4 on Feb 5         Social Determinants of Health   Financial Resource Strain: Low Risk  (03/22/2021)   Overall Financial Resource Strain (CARDIA)    Difficulty of Paying Living Expenses: Not hard at all  Food Insecurity: No Food Insecurity (03/22/2021)   Hunger Vital Sign    Worried About Running Out of Food in the Last Year: Never true    Ran Out of Food in the Last Year: Never true  Transportation Needs: No Transportation Needs (03/22/2021)   PRAPARE - Administrator, Civil Service (Medical): No    Lack of Transportation (Non-Medical): No  Physical Activity: Inactive (03/22/2021)   Exercise Vital Sign    Days of Exercise per Week: 0 days    Minutes of  Exercise per Session: 0 min  Stress: No Stress Concern Present (03/22/2021)   [employer] of Occupational Health - Occupational Stress  Questionnaire    Feeling of Stress : Only a little  Social Connections: Moderately Isolated (03/22/2021)   Social Connection and Isolation Panel [NHANES]    Frequency of Communication with Friends and Family: More than three times a week    Frequency of Social Gatherings with Friends and Family: Twice a week    Attends Religious Services: Never    Database administratorActive Member of Clubs or Organizations: No    Attends Engineer, structuralClub or Organization Meetings: Never    Marital Status: Living with partner      Family History  Problem Relation Age of Onset   Pulmonary embolism Mother    Parkinson's disease Father    Diabetes Father    Cancer Father        prostate   Hypertension Maternal Grandmother    Diabetes Maternal Grandmother    Kidney cancer Maternal Grandmother    Alcohol abuse Maternal Grandfather    Hypertension Paternal Grandmother    Breast cancer Paternal Grandmother    Diabetes Paternal Grandmother    Alcohol abuse Paternal Grandfather    Colon cancer Neg Hx       ROS:   Please see the history of present illness.    Reports shortness of breath on exertion with palpitations All other systems reviewed and are negative.   Labs/EKG Reviewed:    EKG:   EKG is was not ordered today.    Recent Labs: 09/08/2021: Hemoglobin 12.7; Platelets 233   Recent Lipid Panel No results found for: "CHOL", "TRIG", "HDL", "CHOLHDL", "LDLCALC", "LDLDIRECT"  Physical Exam:    VS:  BP 127/71   Pulse (!) 119   Ht 5\' 7"  (1.702 m)   Wt 195 lb (88.5 kg)   LMP 06/11/2021   SpO2 94%   BMI 30.54 kg/m     Wt Readings from Last 3 Encounters:  12/01/21 195 lb (88.5 kg)  11/22/21 197 lb 8 oz (89.6 kg)  10/29/21 193 lb 3.2 oz (87.6 kg)     GEN: Well nourished, well developed in no acute distress HEENT: Normal NECK: No JVD; No carotid bruits LYMPHATICS: No  lymphadenopathy CARDIAC: Telemetry RRR, no murmurs, rubs, gallops RESPIRATORY:  Clear to auscultation without rales, wheezing or rhonchi  ABDOMEN: Soft, non-tender, non-distended MUSCULOSKELETAL:  No edema; No deformity  SKIN: Warm and dry NEUROLOGIC:  Alert and oriented x 3 PSYCHIATRIC:  Normal affect    Risk Assessment/Risk Calculators:                 ASSESSMENT & PLAN:    Frequent PVCs. High risk pregnancy  Her monitor does show evidence of frequent PVCs.  She is currently on 50 mg of metoprolol succinate.  I am not going to increase this dosing given the fact that I would like for her to get a repeat fetal ultrasound to monitor to make sure that intrauterine growth restriction does not become a problem for this patient given the high dose of beta-blocker.  She tells me that her OB/GYN is going to be getting an ultrasound soon.  We will follow-up that result.  Thankfully her symptoms has improved.  Also it may not be of great benefit to increase her beta-blocker at this time if she is not symptomatic especially in sense of meeting or at risk for developing intrauterine growth restriction.  She is on therapeutic Lovenox continue this medication.  Patient Instructions  Medication Instructions:  Your physician recommends that you continue on your current medications as directed. Please refer to the Current  Medication list given to you today.  *If you need a refill on your cardiac medications before your next appointment, please call your pharmacy*   Lab Work: NONE If you have labs (blood work) drawn today and your tests are completely normal, you will receive your results only by: MyChart Message (if you have MyChart) OR A paper copy in the mail If you have any lab test that is abnormal or we need to change your treatment, we will call you to review the results.   Testing/Procedures: NONE   Follow-Up: At [facility], you and your health needs are our  priority.  As part of our continuing mission to provide you with exceptional heart care, we have created designated Provider Care Teams.  These Care Teams include your primary Cardiologist (physician) and Advanced Practice Providers (APPs -  Physician Assistants and Nurse Practitioners) who all work together to provide you with the care you need, when you need it.  We recommend signing up for the patient portal called "MyChart".  Sign up information is provided on this After Visit Summary.  MyChart is used to connect with patients for Virtual Visits (Telemedicine).  Patients are able to view lab/test results, encounter notes, upcoming appointments, etc.  Non-urgent messages can be sent to your provider as well.   To learn more about what you can do with MyChart, go to [email or web address].    Your next appointment:   Please keep upcoming appointment in January.    The format for your next appointment:   In Person  Provider:   Thomasene Ripple, Do       Dispo:  No follow-ups on file.   Medication Adjustments/Labs and Tests Ordered: Current medicines are reviewed at length with the patient today.  Concerns regarding medicines are outlined above.  Tests Ordered: No orders of the defined types were placed in this encounter.  Medication Changes: No orders of the defined types were placed in this encounter.

## 2021-12-20 ENCOUNTER — Other Ambulatory Visit: Payer: No Typology Code available for payment source

## 2021-12-20 ENCOUNTER — Other Ambulatory Visit (HOSPITAL_COMMUNITY): Payer: Self-pay

## 2021-12-20 ENCOUNTER — Encounter: Payer: Self-pay | Admitting: Advanced Practice Midwife

## 2021-12-20 ENCOUNTER — Other Ambulatory Visit: Payer: Self-pay | Admitting: Advanced Practice Midwife

## 2021-12-20 ENCOUNTER — Ambulatory Visit (INDEPENDENT_AMBULATORY_CARE_PROVIDER_SITE_OTHER): Payer: No Typology Code available for payment source | Admitting: Advanced Practice Midwife

## 2021-12-20 ENCOUNTER — Ambulatory Visit (INDEPENDENT_AMBULATORY_CARE_PROVIDER_SITE_OTHER): Payer: No Typology Code available for payment source

## 2021-12-20 VITALS — BP 119/74 | HR 91 | Wt 202.5 lb

## 2021-12-20 DIAGNOSIS — Z79899 Other long term (current) drug therapy: Secondary | ICD-10-CM

## 2021-12-20 DIAGNOSIS — Z3480 Encounter for supervision of other normal pregnancy, unspecified trimester: Secondary | ICD-10-CM

## 2021-12-20 DIAGNOSIS — Z3482 Encounter for supervision of other normal pregnancy, second trimester: Secondary | ICD-10-CM | POA: Diagnosis not present

## 2021-12-20 DIAGNOSIS — Z3A27 27 weeks gestation of pregnancy: Secondary | ICD-10-CM

## 2021-12-20 DIAGNOSIS — Z131 Encounter for screening for diabetes mellitus: Secondary | ICD-10-CM

## 2021-12-20 DIAGNOSIS — R000 Tachycardia, unspecified: Secondary | ICD-10-CM | POA: Diagnosis not present

## 2021-12-20 MED ORDER — METOPROLOL TARTRATE 25 MG PO TABS
12.5000 mg | ORAL_TABLET | Freq: Three times a day (TID) | ORAL | 0 refills | Status: DC | PRN
  Filled 2021-12-20: qty 30, 20d supply, fill #0

## 2021-12-20 NOTE — Progress Notes (Signed)
   LOW-RISK PREGNANCY VISIT Patient name: Shirley Peters MRN [id number]  Date of birth: 1990/04/11 Chief Complaint:   Routine Prenatal Visit (PN2 & [place] today)  History of Present Illness:   Shirley Peters is a 31 y.o. G2P1001 female at [redacted]w[redacted]d with an Estimated Date of Delivery: 03/18/22 being seen today for ongoing management of a low-risk pregnancy.  Today she reports having occ PVCs.  Cardiologist says continue 25mg  toprol daily (XL).  Chatted w/her regarding getting OK to add a prn immediate release dose for days when the PVCs are uncomfortably frequent. Will rx 12.5 mg prn HR >140. Contractions: Not present. Vag. Bleeding: None.  Movement: Present. denies leaking of fluid. Review of Systems:   Pertinent items are noted in HPI Denies abnormal vaginal discharge w/ itching/odor/irritation, headaches, visual changes, shortness of breath, chest pain, abdominal pain, severe nausea/vomiting, or problems with urination or bowel movements unless otherwise stated above. Pertinent History Reviewed:  Reviewed past medical,surgical, social, obstetrical and family history.  Reviewed problem list, medications and allergies. Physical Assessment:   Vitals:   12/20/21 1129  BP: 119/74  Pulse: 91  Weight: 202 lb 8 oz (91.9 kg)  Body mass index is 31.72 kg/m.        Physical Examination:   General appearance: Well appearing, and in no distress  Mental status: Alert, oriented to person, place, and time  Skin: Warm & dry  Cardiovascular: Normal heart rate noted  Respiratory: Normal respiratory effort, no distress  Abdomen: Soft, gravid, nontender  Pelvic: Cervical exam deferred         Extremities:    Fetal Status:     Movement: Present  14/11/23 27+3 wks,cephalic,cx 3.7 cm,anterior placenta gr 0,AFI 18 cm,FHR 146 bpm,EFW 1241 g 79%   Chaperone:  N/A    No results found for this or any previous visit (from the past 24 hour(s)).  Assessment & Plan:  1) Low-risk pregnancy G2P1001 at [redacted]w[redacted]d with an Estimated  Date of Delivery: 03/18/22   2) PVCs, continue toprol XL 25mg  daily, may add 12.5mg  (IR) prn HR >140 per Dr, 05/18/22.  Continue EFWs to monitor for FGR   Meds: No orders of the defined types were placed in this encounter.  Labs/procedures today: PN2, EFW  Plan:  Continue routine obstetrical care  Next visit: prefers in person    Reviewed: Preterm labor symptoms and general obstetric precautions including but not limited to vaginal bleeding, contractions, leaking of fluid and fetal movement were reviewed in detail with the patient.  All questions were answered. Has home bp cuff.. Check bp weekly, let know if >140/90.   Follow-up: Return in about 3 weeks (around 01/10/2022) for LROB.  Future Appointments  Date Time Provider Department Center  01/12/2022  8:30 AM Autry-Lott, [place], DO [facility]  02/08/2022  9:20 AM Tobb, [place], DO [facility] None  03/24/2022 11:00 AM Lavona Mound, MD [facility] 03/26/2022 [facility]    Orders Placed This Encounter  Procedures   Jonelle [facility]le OB Follow Up   Pattricia Boss DNP, CNM 12/20/2021 2:49 PM

## 2021-12-20 NOTE — Progress Notes (Signed)
[place] 27+3 wks,cephalic,cx 3.7 cm,anterior placenta gr 0,AFI 18 cm,FHR 146 bpm,EFW 1241 g 79%

## 2021-12-20 NOTE — Patient Instructions (Signed)
Shirley Peters, I greatly value your feedback.  If you receive a survey following your visit with [place] today, we appreciate you taking the time to fill it out.  Thanks, Cathie Beams, CNM   [facility] HAS MOVED!!! It is now [facility] at [facility] ([address] [place], [place] [address]) Entrance located off of [address] Free 24/7 valet parking   Go to [email or web address] to register for FREE online childbirth classes    Call the office [phone]) or go to [facility] if: You begin to have strong, frequent contractions Your water breaks.  Sometimes it is a big gush of fluid, sometimes it is just a trickle that keeps getting your panties wet or running down your legs You have vaginal bleeding.  It is normal to have a small amount of spotting if your cervix was checked.  You don't feel your baby moving like normal.  If you don't, get you something to eat and drink and lay down and focus on feeling your baby move.  You should feel at least 10 movements in 2 hours.  If you don't, you should call the office or go to [facility].    Tdap Vaccine It is recommended that you get the Tdap vaccine during the third trimester of EACH pregnancy to help protect your baby from getting pertussis (whooping cough) 27-36 weeks is the BEST time to do this so that you can pass the protection on to your baby. During pregnancy is better than after pregnancy, but if you are unable to get it during pregnancy it will be offered at the hospital.  You will be offered this vaccine in the office after 27 weeks. If you do not have health insurance, you can get this vaccine at the health department or your family doctor Everyone who will be around your baby should also be up-to-date on their vaccines. Adults (who are not pregnant) only need 1 dose of Tdap during adulthood.   Third Trimester of Pregnancy The third trimester is from week 29 through week 42, months 7 through 9. The third trimester  is a time when the fetus is growing rapidly. At the end of the ninth month, the fetus is about 20 inches in length and weighs 6-10 pounds.  BODY CHANGES Your body goes through many changes during pregnancy. The changes vary from woman to woman.  Your weight will continue to increase. You can expect to gain 25-35 pounds (11-16 kg) by the end of the pregnancy. You may begin to get stretch marks on your hips, abdomen, and breasts. You may urinate more often because the fetus is moving lower into your pelvis and pressing on your bladder. You may develop or continue to have heartburn as a result of your pregnancy. You may develop constipation because certain hormones are causing the muscles that push waste through your intestines to slow down. You may develop hemorrhoids or swollen, bulging veins (varicose veins). You may have pelvic pain because of the weight gain and pregnancy hormones relaxing your joints between the bones in your pelvis. Backaches may result from overexertion of the muscles supporting your posture. You may have changes in your hair. These can include thickening of your hair, rapid growth, and changes in texture. Some women also have hair loss during or after pregnancy, or hair that feels dry or thin. Your hair will most likely return to normal after your baby is born. Your breasts will continue to grow and be tender. A  yellow discharge may leak from your breasts called colostrum. Your belly button may stick out. You may feel short of breath because of your expanding uterus. You may notice the fetus "dropping," or moving lower in your abdomen. You may have a bloody mucus discharge. This usually occurs a few days to a week before labor begins. Your cervix becomes thin and soft (effaced) near your due date. WHAT TO EXPECT AT YOUR PRENATAL EXAMS  You will have prenatal exams every 2 weeks until week 36. Then, you will have weekly prenatal exams. During a routine prenatal visit: You  will be weighed to make sure you and the fetus are growing normally. Your blood pressure is taken. Your abdomen will be measured to track your baby's growth. The fetal heartbeat will be listened to. Any test results from the previous visit will be discussed. You may have a cervical check near your due date to see if you have effaced. At around 36 weeks, your caregiver will check your cervix. At the same time, your caregiver will also perform a test on the secretions of the vaginal tissue. This test is to determine if a type of bacteria, Group B streptococcus, is present. Your caregiver will explain this further. Your caregiver may ask you: What your birth plan is. How you are feeling. If you are feeling the baby move. If you have had any abnormal symptoms, such as leaking fluid, bleeding, severe headaches, or abdominal cramping. If you have any questions. Other tests or screenings that may be performed during your third trimester include: Blood tests that check for low iron levels (anemia). Fetal testing to check the health, activity level, and growth of the fetus. Testing is done if you have certain medical conditions or if there are problems during the pregnancy. FALSE LABOR You may feel small, irregular contractions that eventually go away. These are called Braxton Hicks contractions, or false labor. Contractions may last for hours, days, or even weeks before true labor sets in. If contractions come at regular intervals, intensify, or become painful, it is best to be seen by your caregiver.  SIGNS OF LABOR  Menstrual-like cramps. Contractions that are 5 minutes apart or less. Contractions that start on the top of the uterus and spread down to the lower abdomen and back. A sense of increased pelvic pressure or back pain. A watery or bloody mucus discharge that comes from the vagina. If you have any of these signs before the 37th week of pregnancy, call your caregiver right away. You need to  go to the hospital to get checked immediately. HOME CARE INSTRUCTIONS  Avoid all smoking, herbs, alcohol, and unprescribed drugs. These chemicals affect the formation and growth of the baby. Follow your caregiver's instructions regarding medicine use. There are medicines that are either safe or unsafe to take during pregnancy. Exercise only as directed by your caregiver. Experiencing uterine cramps is a good sign to stop exercising. Continue to eat regular, healthy meals. Wear a good support bra for breast tenderness. Do not use hot tubs, steam rooms, or saunas. Wear your seat belt at all times when driving. Avoid raw meat, uncooked cheese, cat litter boxes, and soil used by cats. These carry germs that can cause birth defects in the baby. Take your prenatal vitamins. Try taking a stool softener (if your caregiver approves) if you develop constipation. Eat more high-fiber foods, such as fresh vegetables or fruit and whole grains. Drink plenty of fluids to keep your urine clear or pale yellow.  Take warm sitz baths to soothe any pain or discomfort caused by hemorrhoids. Use hemorrhoid cream if your caregiver approves. If you develop varicose veins, wear support hose. Elevate your feet for 15 minutes, 3-4 times a day. Limit salt in your diet. Avoid heavy lifting, wear low heal shoes, and practice good posture. Rest a lot with your legs elevated if you have leg cramps or low back pain. Visit your dentist if you have not gone during your pregnancy. Use a soft toothbrush to brush your teeth and be gentle when you floss. A sexual relationship may be continued unless your caregiver directs you otherwise. Do not travel far distances unless it is absolutely necessary and only with the approval of your caregiver. Take prenatal classes to understand, practice, and ask questions about the labor and delivery. Make a trial run to the hospital. Pack your hospital bag. Prepare the baby's nursery. Continue to  go to all your prenatal visits as directed by your caregiver. SEEK MEDICAL CARE IF: You are unsure if you are in labor or if your water has broken. You have dizziness. You have mild pelvic cramps, pelvic pressure, or nagging pain in your abdominal area. You have persistent nausea, vomiting, or diarrhea. You have a bad smelling vaginal discharge. You have pain with urination. SEEK IMMEDIATE MEDICAL CARE IF:  You have a fever. You are leaking fluid from your vagina. You have spotting or bleeding from your vagina. You have severe abdominal cramping or pain. You have rapid weight loss or gain. You have shortness of breath with chest pain. You notice sudden or extreme swelling of your face, hands, ankles, feet, or legs. You have not felt your baby move in over an hour. You have severe headaches that do not go away with medicine. You have vision changes. Document Released: 12/21/2000 Document Revised: 01/01/2013 Document Reviewed: 02/28/2012 [facility] Patient Information 2015 [place], [place]. This information is not intended to replace advice given to you by your health care provider. Make sure you discuss any questions you have with your health care provider.

## 2021-12-21 LAB — RPR: RPR Ser Ql: NONREACTIVE

## 2021-12-21 LAB — GLUCOSE TOLERANCE, 2 HOURS W/ 1HR
Glucose, 1 hour: 141 mg/dL (ref 70–179)
Glucose, 2 hour: 105 mg/dL (ref 70–152)
Glucose, Fasting: 78 mg/dL (ref 70–91)

## 2021-12-21 LAB — CBC
Hematocrit: 32.9 % — ABNORMAL LOW (ref 34.0–46.6)
Hemoglobin: 11.5 g/dL (ref 11.1–15.9)
MCH: 30.3 pg (ref 26.6–33.0)
MCHC: 35 g/dL (ref 31.5–35.7)
MCV: 87 fL (ref 79–97)
Platelets: 203 10*3/uL (ref 150–450)
RBC: 3.79 x10E6/uL (ref 3.77–5.28)
RDW: 13.3 % (ref 11.7–15.4)
WBC: 5.3 10*3/uL (ref 3.4–10.8)

## 2021-12-21 LAB — ANTIBODY SCREEN: Antibody Screen: NEGATIVE

## 2021-12-21 LAB — HIV ANTIBODY (ROUTINE TESTING W REFLEX): HIV Screen 4th Generation wRfx: NONREACTIVE

## 2021-12-22 ENCOUNTER — Other Ambulatory Visit: Payer: No Typology Code available for payment source

## 2021-12-22 ENCOUNTER — Encounter: Payer: No Typology Code available for payment source | Admitting: Advanced Practice Midwife

## 2021-12-23 ENCOUNTER — Other Ambulatory Visit: Payer: No Typology Code available for payment source

## 2022-01-05 ENCOUNTER — Other Ambulatory Visit (HOSPITAL_COMMUNITY): Payer: Self-pay

## 2022-01-07 ENCOUNTER — Other Ambulatory Visit (HOSPITAL_COMMUNITY): Payer: Self-pay

## 2022-01-10 NOTE — L&D Delivery Note (Signed)
OB/GYN Faculty Practice Delivery Note  Shirley Peters is a 32 y.o. G2P1001 s/p SVD at [redacted]w[redacted]d She was admitted for IOL for cHTN.   ROM: 3h 546mith clear fluid GBS Status:  --/Positive (02/13 1400) Maximum Maternal Temperature:  Temp (24hrs), Avg:98.8 F (37.1 C), Min:98.8 F (37.1 C), Max:98.9 F (37.2 C)    Labor Progress: Patient arrived at 2 cm dilation and was induced with cytotec, AROM, pitocin.   Delivery Date/Time: 02/25/2022 at 1948 Delivery: Called to room and patient was complete and pushing. Head delivered in LOA position. No nuchal cord present. Shoulder and body delivered in usual fashion. Infant with spontaneous cry, placed on mother's abdomen, dried and stimulated. Cord clamped x 2 after 1-minute delay, and cut by FOB. Cord blood drawn. Placenta delivered spontaneously with gentle cord traction. Fundus firm with massage and Pitocin. Labia, perineum, vagina, and cervix inspected with 1st degree perineal laceration .   Placenta: spontaneous, intact, 3 vessel cord Complications: None Lacerations: 1st degree perineal repaired EBL: 413 mL Analgesia: epidural    Infant: APGAR (1 MIN): 9   APGAR (5 MINS): 9   APGAR (10 MINS):    Weight: Pending   ViGifford ShaveMD  OB Fellow  02/25/2022 8:22 PM

## 2022-01-11 ENCOUNTER — Other Ambulatory Visit (HOSPITAL_COMMUNITY): Payer: Self-pay

## 2022-01-12 ENCOUNTER — Other Ambulatory Visit: Payer: Self-pay

## 2022-01-12 ENCOUNTER — Other Ambulatory Visit (HOSPITAL_COMMUNITY): Payer: Self-pay

## 2022-01-12 ENCOUNTER — Ambulatory Visit (INDEPENDENT_AMBULATORY_CARE_PROVIDER_SITE_OTHER): Payer: 59 | Admitting: Family Medicine

## 2022-01-12 ENCOUNTER — Encounter: Payer: Self-pay | Admitting: Family Medicine

## 2022-01-12 VITALS — BP 127/75 | HR 96 | Wt 208.0 lb

## 2022-01-12 DIAGNOSIS — Z3A30 30 weeks gestation of pregnancy: Secondary | ICD-10-CM

## 2022-01-12 DIAGNOSIS — Z86711 Personal history of pulmonary embolism: Secondary | ICD-10-CM

## 2022-01-12 DIAGNOSIS — Z3480 Encounter for supervision of other normal pregnancy, unspecified trimester: Secondary | ICD-10-CM

## 2022-01-12 MED ORDER — METOPROLOL SUCCINATE ER 50 MG PO TB24
50.0000 mg | ORAL_TABLET | Freq: Every day | ORAL | 0 refills | Status: DC
  Filled 2022-01-12: qty 30, 30d supply, fill #0

## 2022-01-12 NOTE — Progress Notes (Signed)
   PRENATAL VISIT NOTE  Subjective:  Shirley Peters is a 32 y.o. G2P1001 at [redacted]w[redacted]d being seen today for ongoing prenatal care.  She is currently monitored for the following issues for this high-risk pregnancy and has Pulmonary embolus ([place]); Encounter for supervision of normal pregnancy, antepartum; and Tachycardia & palpitations on their problem list.  Patient reports no complaints.  Contractions: Irritability. Vag. Bleeding: None.  Movement: Present. Denies leaking of fluid.   The following portions of the patient's history were reviewed and updated as appropriate: allergies, current medications, past family history, past medical history, past social history, past surgical history and problem list.   Objective:   Vitals:   01/12/22 0851  BP: 127/75  Pulse: 96  Weight: 208 lb (94.3 kg)    Fetal Status: Fetal Heart Rate (bpm): 146 Fundal Height: 32 cm Movement: Present     General:  Alert, oriented and cooperative. Patient is in no acute distress.  Skin: Skin is warm and dry. No rash noted.   Cardiovascular: Normal heart rate noted  Respiratory: Normal respiratory effort, no problems with respiration noted  Abdomen: Soft, gravid, appropriate for gestational age.  Pain/Pressure: Present     Pelvic: Cervical exam deferred        Extremities: Normal range of motion.     Mental Status: Normal mood and affect. Normal behavior. Normal judgment and thought content.   Assessment and Plan:  Pregnancy: G2P1001 at [redacted]w[redacted]d 1. Supervision of other normal pregnancy, antepartum Doing well no concerns.   2. [redacted] weeks gestation of pregnancy Declined Tdap  3. History of pulmonary embolism Lovenox now, and until 6 weeks PP  Preterm labor symptoms and general obstetric precautions including but not limited to vaginal bleeding, contractions, leaking of fluid and fetal movement were reviewed in detail with the patient. Please refer to After Visit Summary for other counseling recommendations.   Return  in about 2 weeks (around 01/26/2022) for HROB follow up.  Future Appointments  Date Time Provider [place]  01/27/2022 10:00 AM [facility] - [facility] [place] [facility] None  01/27/2022 10:50 AM Christin Fudge, CNM [facility]  02/24/2022  8:20 AM Tobb, Godfrey Pick, DO [facility] None  03/24/2022 11:00 AM Satira Sark, MD [facility]    Nimsi Males Aut[place]-Lott, DO

## 2022-01-27 ENCOUNTER — Ambulatory Visit (INDEPENDENT_AMBULATORY_CARE_PROVIDER_SITE_OTHER): Payer: 59

## 2022-01-27 ENCOUNTER — Encounter: Payer: Self-pay | Admitting: Advanced Practice Midwife

## 2022-01-27 ENCOUNTER — Ambulatory Visit (INDEPENDENT_AMBULATORY_CARE_PROVIDER_SITE_OTHER): Payer: 59 | Admitting: Advanced Practice Midwife

## 2022-01-27 VITALS — BP 123/71 | HR 98 | Wt 210.4 lb

## 2022-01-27 DIAGNOSIS — Z79899 Other long term (current) drug therapy: Secondary | ICD-10-CM | POA: Diagnosis not present

## 2022-01-27 DIAGNOSIS — Z3480 Encounter for supervision of other normal pregnancy, unspecified trimester: Secondary | ICD-10-CM

## 2022-01-27 DIAGNOSIS — Z3A32 32 weeks gestation of pregnancy: Secondary | ICD-10-CM

## 2022-01-27 NOTE — Patient Instructions (Signed)
Shirley Peters, I greatly value your feedback.  If you receive a survey following your visit with [place] today, we appreciate you taking the time to fill it out.  Thanks, Shirley Berthold, DNP, CNM  [place]!!! It is now [place] at [facility] ([place], [place] [address]) Entrance located off of [address] parking   Go to [email or web address] to register for FREE online childbirth classes    Call the office [phone]) or go to [place] if: You begin to have strong, frequent contractions Your water breaks.  Sometimes it is a big gush of fluid, sometimes it is just a trickle that keeps getting your panties wet or running down your legs You have vaginal bleeding.  It is normal to have a small amount of spotting if your cervix was checked.  You don't feel your baby moving like normal.  If you don't, get you something to eat and drink and lay down and focus on feeling your baby move.  You should feel at least 10 movements in 2 hours.  If you don't, you should call the office or go to [place] Blood Pressure Monitoring for Patients   Your provider has recommended that you check your blood pressure (BP) at least once a week at home. If you do not have a blood pressure cuff at home, one will be provided for you. Contact your provider if you have not received your monitor within 1 week.   Helpful Tips for Accurate Home Blood Pressure Checks  Don't smoke, exercise, or drink caffeine 30 minutes before checking your BP Use the restroom before checking your BP (a full bladder can raise your pressure) Relax in a comfortable upright chair Feet on the ground Left arm resting comfortably on a flat surface at the level of your heart Legs uncrossed Back supported Sit quietly and don't talk Place the cuff on your bare arm Adjust snuggly, so that only two fingertips can fit between your skin and the top of  the cuff Check 2 readings separated by at least one minute Keep a log of your BP readings For a visual, please reference this diagram: http://ccnc.care/bpdiagram  Provider Name: [facility]     Phone: [phone]  Zone 1: ALL CLEAR  Continue to monitor your symptoms:  BP reading is less than 140 (top number) or less than 90 (bottom number)  No right upper stomach pain No headaches or seeing spots No feeling nauseated or throwing up No swelling in face and hands  Zone 2: CAUTION Call your doctor's office for any of the following:  BP reading is greater than 140 (top number) or greater than 90 (bottom number)  Stomach pain under your ribs in the middle or right side Headaches or seeing spots Feeling nauseated or throwing up Swelling in face and hands  Zone 3: EMERGENCY  Seek immediate medical care if you have any of the following:  BP reading is greater than160 (top number) or greater than 110 (bottom number) Severe headaches not improving with Tylenol Serious difficulty catching your breath Any worsening symptoms from Zone 2

## 2022-01-27 NOTE — Progress Notes (Signed)
   LOW-RISK PREGNANCY VISIT Patient name: Shirley Peters MRN [id number]  Date of birth: [birth year] Chief Complaint:   Routine Prenatal Visit and Pregnancy Ultrasound  History of Present Illness:   Shirley Peters is a 32 y.o. G16P1001 female at [redacted]w[redacted]d with an Estimated Date of Delivery: 03/18/22 being seen today for ongoing management of a low-risk pregnancy.  Today she reports no complaints. Contractions: Irritability.  .  Movement: Present. denies leaking of fluid. Review of Systems:   Pertinent items are noted in HPI Denies abnormal vaginal discharge w/ itching/odor/irritation, headaches, visual changes, shortness of breath, chest pain, abdominal pain, severe nausea/vomiting, or problems with urination or bowel movements unless otherwise stated above. Pertinent History Reviewed:  Reviewed past medical,surgical, social, obstetrical and family history.  Reviewed problem list, medications and allergies. Physical Assessment:   Vitals:   01/27/22 1035  BP: 123/71  Pulse: 98  Weight: 210 lb 6.4 oz (95.4 kg)  Body mass index is 32.95 kg/m.        Physical Examination:   General appearance: Well appearing, and in no distress  Mental status: Alert, oriented to person, place, and time  Skin: Warm & dry  Cardiovascular: Normal heart rate noted  Respiratory: Normal respiratory effort, no distress  Abdomen: Soft, gravid, nontender  Pelvic: Cervical exam deferred         Extremities: Edema: Trace  Fetal Status:     Movement: Present  [place] 02+4 wks,cephalic,anterior placenta gr 3,FHR 142 bpm,AFI 13 cm,mild dilated left lateral ventricle 1.1 cm,right ventricle normal .8 cm,EFW 2362 g 79%,AC 96%   Chaperone:  N/A    No results found for this or any previous visit (from the past 24 hour(s)).  Assessment & Plan:  1) Low-risk pregnancy G2P1001 at [redacted]w[redacted]d with an Estimated Date of Delivery: 03/18/22   2) Provoked PE, Continue lovenox 40mg /day,    Meds: No orders of the defined types were placed in this  encounter.  Labs/procedures today: EFW  Plan:  Continue routine obstetrical care  Next visit: prefers in person    Reviewed: Preterm labor symptoms and general obstetric precautions including but not limited to vaginal bleeding, contractions, leaking of fluid and fetal movement were reviewed in detail with the patient.  All questions were answered. Has home bp cuff.. Check bp weekly, let us know if >140/90.   Follow-up: No follow-ups on file.  Future Appointments  Date Time Provider [place]  02/24/2022  8:20 AM Berniece Salines, DO [facility] None  03/02/2022  9:15 AM [place] - [facility] [place] [facility] None  03/02/2022 10:10 AM Myrtis Ser, CNM [facility]  03/24/2022 11:00 AM Satira Sark, MD [facility] [place] [facility]    No orders of the defined types were placed in this encounter.  Christin Fudge DNP, CNM 01/27/2022 11:12 AM

## 2022-01-27 NOTE — Progress Notes (Signed)
[place] 40+9 wks,cephalic,anterior placenta gr 3,FHR 142 bpm,AFI 13 cm,mild dilated left lateral ventricle 1.1 cm,right ventricle normal .8 cm,EFW 2362 g 79%,AC 96%

## 2022-02-02 DIAGNOSIS — Z0289 Encounter for other administrative examinations: Secondary | ICD-10-CM

## 2022-02-08 ENCOUNTER — Ambulatory Visit: Payer: No Typology Code available for payment source | Admitting: Cardiology

## 2022-02-09 ENCOUNTER — Other Ambulatory Visit (HOSPITAL_COMMUNITY): Payer: Self-pay

## 2022-02-09 MED ORDER — METOPROLOL SUCCINATE ER 50 MG PO TB24
50.0000 mg | ORAL_TABLET | Freq: Every day | ORAL | 0 refills | Status: DC
  Filled 2022-02-09: qty 30, 30d supply, fill #0

## 2022-02-11 ENCOUNTER — Encounter: Payer: Self-pay | Admitting: *Deleted

## 2022-02-11 ENCOUNTER — Ambulatory Visit (INDEPENDENT_AMBULATORY_CARE_PROVIDER_SITE_OTHER): Payer: 59 | Admitting: Advanced Practice Midwife

## 2022-02-11 ENCOUNTER — Encounter: Payer: Self-pay | Admitting: Advanced Practice Midwife

## 2022-02-11 VITALS — BP 126/82 | HR 88 | Wt 216.8 lb

## 2022-02-11 DIAGNOSIS — Z3483 Encounter for supervision of other normal pregnancy, third trimester: Secondary | ICD-10-CM

## 2022-02-11 DIAGNOSIS — R000 Tachycardia, unspecified: Secondary | ICD-10-CM

## 2022-02-11 DIAGNOSIS — Z3480 Encounter for supervision of other normal pregnancy, unspecified trimester: Secondary | ICD-10-CM

## 2022-02-11 DIAGNOSIS — Z3A35 35 weeks gestation of pregnancy: Secondary | ICD-10-CM

## 2022-02-11 NOTE — Progress Notes (Signed)
   LOW-RISK PREGNANCY VISIT Patient name: Shirley Peters MRN [id number]  Date of birth: [birth year] Chief Complaint:   Routine Prenatal Visit  History of Present Illness:   Shirley Peters is a 32 y.o. G51P1001 female at [redacted]w[redacted]d with an Estimated Date of Delivery: 03/18/22 being seen today for ongoing management of a low-risk pregnancy.  Today she reports  continued BLE with L>R; has rarely needed prn dose of metoprolol; reports 'burning' sensation in lower abd at times . Contractions: Not present.  .  Movement: Present. denies leaking of fluid. Review of Systems:   Pertinent items are noted in HPI Denies abnormal vaginal discharge w/ itching/odor/irritation, headaches, visual changes, shortness of breath, chest pain, abdominal pain, severe nausea/vomiting, or problems with urination or bowel movements unless otherwise stated above. Pertinent History Reviewed:  Reviewed past medical,surgical, social, obstetrical and family history.  Reviewed problem list, medications and allergies. Physical Assessment:   Vitals:   02/11/22 0841  BP: 126/82  Pulse: 88  Weight: 216 lb 12.8 oz (98.3 kg)  Body mass index is 33.96 kg/m.        Physical Examination:   General appearance: Well appearing, and in no distress  Mental status: Alert, oriented to person, place, and time  Skin: Warm & dry  Cardiovascular: Normal heart rate noted  Respiratory: Normal respiratory effort, no distress  Abdomen: Soft, gravid, nontender  Pelvic: Cervical exam deferred         Extremities: Edema: Trace  Fetal Status: Fetal Heart Rate (bpm): 145 Fundal Height: 35 cm Movement: Present    No results found for this or any previous visit (from the past 24 hour(s)).  Assessment & Plan:  1) Low-risk pregnancy G2P1001 at [redacted]w[redacted]d with an Estimated Date of Delivery: 03/18/22   2) Hx provoked PE, on Lovenox 40mg /d  3) Tachycardia/palpitations, well-controlled w metoprolol; has cards appt 02/24/22 (nl echo in Nov)    Meds: No orders of  the defined types were placed in this encounter.  Labs/procedures today: none  Plan:  Continue routine obstetrical care   Reviewed: Preterm labor symptoms and general obstetric precautions including but not limited to vaginal bleeding, contractions, leaking of fluid and fetal movement were reviewed in detail with the patient.  All questions were answered. Has home bp cuff. Check bp weekly, let us know if >140/90.   Follow-up: Return for change next appt to 1.5-2wks with GBS/cultures; then every 1wk after that, LROB.  No orders of the defined types were placed in this encounter.  Myrtis Ser CNM 02/11/2022 9:18 AM

## 2022-02-17 DIAGNOSIS — Z3482 Encounter for supervision of other normal pregnancy, second trimester: Secondary | ICD-10-CM | POA: Diagnosis not present

## 2022-02-17 DIAGNOSIS — Z3483 Encounter for supervision of other normal pregnancy, third trimester: Secondary | ICD-10-CM | POA: Diagnosis not present

## 2022-02-20 ENCOUNTER — Encounter: Payer: Self-pay | Admitting: Obstetrics and Gynecology

## 2022-02-22 ENCOUNTER — Other Ambulatory Visit (HOSPITAL_COMMUNITY)
Admission: RE | Admit: 2022-02-22 | Discharge: 2022-02-22 | Disposition: A | Discharge status: Home / Self Care | Payer: 59 | Source: Ambulatory Visit | Attending: Advanced Practice Midwife | Admitting: Advanced Practice Midwife

## 2022-02-22 ENCOUNTER — Encounter: Payer: Self-pay | Admitting: Women's Health

## 2022-02-22 ENCOUNTER — Ambulatory Visit (INDEPENDENT_AMBULATORY_CARE_PROVIDER_SITE_OTHER): Payer: 59 | Admitting: Women's Health

## 2022-02-22 VITALS — BP 149/91 | HR 103 | Wt 224.0 lb

## 2022-02-22 DIAGNOSIS — Z86711 Personal history of pulmonary embolism: Secondary | ICD-10-CM

## 2022-02-22 DIAGNOSIS — Z3A36 36 weeks gestation of pregnancy: Secondary | ICD-10-CM

## 2022-02-22 DIAGNOSIS — O0993 Supervision of high risk pregnancy, unspecified, third trimester: Secondary | ICD-10-CM

## 2022-02-22 DIAGNOSIS — R030 Elevated blood-pressure reading, without diagnosis of hypertension: Secondary | ICD-10-CM

## 2022-02-22 DIAGNOSIS — Z3483 Encounter for supervision of other normal pregnancy, third trimester: Secondary | ICD-10-CM

## 2022-02-22 LAB — POCT URINALYSIS DIPSTICK OB
Blood, UA: NEGATIVE
Glucose, UA: NEGATIVE
Ketones, UA: NEGATIVE
Leukocytes, UA: NEGATIVE
Nitrite, UA: NEGATIVE
POC,PROTEIN,UA: NEGATIVE

## 2022-02-22 LAB — OB RESULTS CONSOLE GBS
GBS: POSITIVE
GBS: POSITIVE

## 2022-02-22 NOTE — Progress Notes (Signed)
LOW-RISK PREGNANCY VISIT Patient name: Shirley Peters MRN [id number]  Date of birth: March 21, 1990 Chief Complaint:   Routine Prenatal Visit  History of Present Illness:   Shirley Peters is a 32 y.o. G35P1001 female at 34w4dwith an Estimated Date of Delivery: 03/18/22 being seen today for ongoing management of a low-risk pregnancy.   Today she reports  elevated home bp's since Sat, 140s-150s/80s. Did have an isolated SBP of 160 this am, but was 140s on recheck. Some occ headaches, not bad enough to take apap- they eventually go away on their own. No visual changes. Some nausea, no vomiting. Increased swelling. . Contractions: Irritability. Vag. Bleeding: None.  Movement: Present. denies leaking of fluid.     09/06/2021   10:10 AM 03/22/2021    3:01 PM 01/21/2020    8:23 AM 04/10/2019    9:25 AM  Depression screen PHQ 2/9  Decreased Interest 0 0 0 0  Down, Depressed, Hopeless 0 0 0 0  PHQ - 2 Score 0 0 0 0  Altered sleeping 2 1  1  $ Tired, decreased energy 2 1  1  $ Change in appetite 0 0  0  Feeling bad or failure about yourself  0 0  0  Trouble concentrating 0 0  0  Moving slowly or fidgety/restless 0 0  0  Suicidal thoughts 0 0  0  PHQ-9 Score 4 2  2  $ Difficult doing work/chores    Not difficult at all        09/06/2021   10:10 AM 03/22/2021    3:01 PM 04/10/2019    9:26 AM  GAD 7 : Generalized Anxiety Score  Nervous, Anxious, on Edge 0 0 0  Control/stop worrying 0 0 0  Worry too much - different things 0 1 0  Trouble relaxing 0 1 0  Restless 0 0 0  Easily annoyed or irritable 1 1 1  $ Afraid - awful might happen 0 0 0  Total GAD 7 Score 1 3 1  $ Anxiety Difficulty   Not difficult at all      Review of Systems:   Pertinent items are noted in HPI Denies abnormal vaginal discharge w/ itching/odor/irritation, headaches, visual changes, shortness of breath, chest pain, abdominal pain, severe nausea/vomiting, or problems with urination or bowel movements unless otherwise stated  above. Pertinent History Reviewed:  Reviewed past medical,surgical, social, obstetrical and family history.  Reviewed problem list, medications and allergies. Physical Assessment:   Vitals:   02/22/22 1150 02/22/22 1207  BP: (!) 159/97 (!) 149/91  Pulse: 97 (!) 103  Weight: 224 lb (101.6 kg)   Body mass index is 35.08 kg/m.        Physical Examination:   General appearance: Well appearing, and in no distress  Mental status: Alert, oriented to person, place, and time  Skin: Warm & dry  Cardiovascular: Normal heart rate noted  Respiratory: Normal respiratory effort, no distress  Abdomen: Soft, gravid, nontender  Pelvic: Cervical exam performed  Dilation: 2.5 Effacement (%): 50 Station: -3  Extremities: Edema: Deep pitting, indentation remains for a short time  Fetal Status: Fetal Heart Rate (bpm): 160 Fundal Height: 38 cm Movement: Present Presentation: Vertex  Chaperone: ACelene Squibb  Results for orders placed or performed in visit on 02/22/22 (from the past 24 hour(s))  POC Urinalysis Dipstick OB   Collection Time: 02/22/22 11:58 AM  Result Value Ref Range   Color, UA     Clarity, UA  Glucose, UA Negative Negative   Bilirubin, UA     Ketones, UA neg    Spec Grav, UA     Blood, UA neg    pH, UA     POC,PROTEIN,UA Negative Negative, Trace, Small (1+), Moderate (2+), Large (3+), 4+   Urobilinogen, UA     Nitrite, UA neg    Leukocytes, UA Negative Negative   Appearance     Odor      Assessment & Plan:  1) Low-risk pregnancy G2P1001 at 36w4dwith an Estimated Date of Delivery: 03/18/22   2) Elevated bp, no proteinuria, mild occ headaches, otherwise asymptomatic. 8lb wt gain in 11d. Check bp 2x this evening, once in am. Return tomorrow am for nurse bp check and bring log w/ her. Reviewed pre-e s/s, reasons to seek care tonight including severe range bp's 160/110  3) H/O PE> on Lovenox  4) Tachycardia/palpitations> on metoprolol, followed by OB cards   Meds: No  orders of the defined types were placed in this encounter.  Labs/procedures today: GBS, GC/CT, and SVE  Plan:  Continue routine obstetrical care  Next visit: prefers in person    Reviewed: Preterm labor symptoms and general obstetric precautions including but not limited to vaginal bleeding, contractions, leaking of fluid and fetal movement were reviewed in detail with the patient.  All questions were answered.   Follow-up: Return for tomorrow am nurse bp check.  Future Appointments  Date Time Provider [place]ast[place] 02/23/2022  9:30 AM [facility] NURSE [facility]  02/24/2022  8:20 AM Tobb, Kardie, DO [facility] None  02/28/2022 11:30 AM CChristin Fudge CNM [facility]  03/08/2022 11:50 AM BRoma Schanz CNM [facility]  03/16/2022  9:10 AM SMyrtis Ser CNM [facility]  03/24/2022 11:00 AM MSatira Sark MD [facility] [place] [facility]    Orders Placed This Encounter  Procedures   Strep Gp B NAA+Rflx   POC Urinalysis Dipstick OB   KRoma SchanzCNM, WNovamed [facility]2/13/2024 12:45 PM

## 2022-02-22 NOTE — Patient Instructions (Signed)
Levada Dy, thank you for choosing our office today! We appreciate the opportunity to meet your healthcare needs. You may receive a short survey by mail, e-mail, or through [employer]. If you are happy with your care we would appreciate if you could take just a few minutes to complete the survey questions. We read all of your comments and take your feedback very seriously. Thank you again for choosing our office.  [facility] [employer] Team at [place] at [facility] ([place], [place] [address]) Entrance C, located off of [address] parking   CLASSES: Go to [email or web address] to register for classes (childbirth, breastfeeding, waterbirth, infant CPR, daddy bootcamp, etc.)  Call the office [phone]) or go to [facility] if: You begin to have strong, frequent contractions Your water breaks.  Sometimes it is a big gush of fluid, sometimes it is just a trickle that keeps getting your panties wet or running down your legs You have vaginal bleeding.  It is normal to have a small amount of spotting if your cervix was checked.  You don't feel your baby moving like normal.  If you don't, get you something to eat and drink and lay down and focus on feeling your baby move.   If your baby is still not moving like normal, you should call the office or go to [facility].  Call the office [phone]) or go to [facility] hospital for these signs of pre-eclampsia: Severe headache that does not go away with Tylenol Visual changes- seeing spots, double, blurred vision Pain under your right breast or upper abdomen that does not go away with Tums or heartburn medicine Nausea and/or vomiting Severe swelling in your hands, feet, and face   [facility] Pediatricians/Family Doctors [facility]): [address] [place], [place]: [address] [place], [place] [facility]): [place], [phone] (call to ask if accepting patients) [facility] Department: [address], [place], [place] [facility]): [address], [facility]: [address] [place], [facility] [place]: [place], [facility] [facility]): [phone] [facility]: [address] [place]: [address] [place] [facility]: [phone], [phone]  Home Blood Pressure Monitoring for Patients   Your provider has recommended that you check your blood pressure (BP) at least once a week at home. If you do not have a blood pressure cuff at home, one will be provided for you. Contact your provider if you have not received your monitor within 1 week.   Helpful Tips for Accurate Home Blood Pressure Checks  Don't smoke, exercise, or drink caffeine 30 minutes before checking your BP Use the restroom before checking your BP (a full bladder can raise your pressure) Relax in a comfortable upright chair Feet on the ground Left arm resting comfortably on a flat surface at the level of your heart Legs uncrossed Back supported Sit quietly and don't talk Place the cuff on your bare arm Adjust snuggly, so that only two fingertips  can fit between your skin and the top of the cuff Check 2 readings separated by at least one minute Keep a log of your BP readings For a visual, please reference this diagram: http://ccnc.care/bpdiagram  Provider Name: [facility]     Phone: [phone]  Zone 1: ALL CLEAR  Continue to monitor your symptoms:  BP reading is less than 140 (top number) or less than 90 (bottom number)  No right  upper stomach pain No headaches or seeing spots No feeling nauseated or throwing up No swelling in face and hands  Zone 2: CAUTION Call your doctor's office for any of the following:  BP reading is greater than 140 (top number) or greater than 90 (bottom number)  Stomach pain under your ribs in the middle or right side Headaches or seeing spots Feeling nauseated or throwing up Swelling in face and hands  Zone 3: EMERGENCY  Seek immediate medical care if you have any of the following:  BP reading is greater than160 (top number) or greater than 110 (bottom number) Severe headaches not improving with Tylenol Serious difficulty catching your breath Any worsening symptoms from Zone 2   Braxton Hicks Contractions Contractions of the uterus can occur throughout pregnancy, but they are not always a sign that you are in labor. You may have practice contractions called Braxton Hicks contractions. These false labor contractions are sometimes confused with true labor. What are [place] contractions? Braxton Hicks contractions are tightening movements that occur in the muscles of the uterus before labor. Unlike true labor contractions, these contractions do not result in opening (dilation) and thinning of the cervix. Toward the end of pregnancy (32-34 weeks), Braxton Hicks contractions can happen more often and may become stronger. These contractions are sometimes difficult to tell apart from true labor because they can be very uncomfortable. You should not feel embarrassed if you go to the hospital with false labor. Sometimes, the only way to tell if you are in true labor is for your health care provider to look for changes in the cervix. The health care provider will do a physical exam and may monitor your contractions. If you are not in true labor, the exam should show that your cervix is not dilating and your water has not broken. If there are no other health problems associated with your  pregnancy, it is completely safe for you to be sent home with false labor. You may continue to have Braxton Hicks contractions until you go into true labor. How to tell the difference between true labor and false labor True labor Contractions last 30-70 seconds. Contractions become very regular. Discomfort is usually felt in the top of the uterus, and it spreads to the lower abdomen and low back. Contractions do not go away with walking. Contractions usually become more intense and increase in frequency. The cervix dilates and gets thinner. False labor Contractions are usually shorter and not as strong as true labor contractions. Contractions are usually irregular. Contractions are often felt in the front of the lower abdomen and in the groin. Contractions may go away when you walk around or change positions while lying down. Contractions get weaker and are shorter-lasting as time goes on. The cervix usually does not dilate or become thin. Follow these instructions at home:  Take over-the-counter and prescription medicines only as told by your health care provider. Keep up with your usual exercises and follow other instructions from your health care provider. Eat and drink lightly if you think  you are going into labor. If Braxton Hicks contractions are making you uncomfortable: Change your position from lying down or resting to walking, or change from walking to resting. Sit and rest in a tub of warm water. Drink enough fluid to keep your urine pale yellow. Dehydration may cause these contractions. Do slow and deep breathing several times an hour. Keep all follow-up prenatal visits as told by your health care provider. This is important. Contact a health care provider if: You have a fever. You have continuous pain in your abdomen. Get help right away if: Your contractions become stronger, more regular, and closer together. You have fluid leaking or gushing from your vagina. You pass  blood-tinged mucus (bloody show). You have bleeding from your vagina. You have low back pain that you never had before. You feel your baby's head pushing down and causing pelvic pressure. Your baby is not moving inside you as much as it used to. Summary Contractions that occur before labor are called Braxton Hicks contractions, false labor, or practice contractions. Braxton Hicks contractions are usually shorter, weaker, farther apart, and less regular than true labor contractions. True labor contractions usually become progressively stronger and regular, and they become more frequent. Manage discomfort from [facility] contractions by changing position, resting in a warm bath, drinking plenty of water, or practicing deep breathing. This information is not intended to replace advice given to you by your health care provider. Make sure you discuss any questions you have with your health care provider. Document Revised: 12/09/2016 Document Reviewed: 05/12/2016 Elsevier Patient Education  [place].

## 2022-02-23 ENCOUNTER — Ambulatory Visit (INDEPENDENT_AMBULATORY_CARE_PROVIDER_SITE_OTHER): Payer: 59 | Admitting: *Deleted

## 2022-02-23 ENCOUNTER — Other Ambulatory Visit: Payer: Self-pay | Admitting: Advanced Practice Midwife

## 2022-02-23 ENCOUNTER — Encounter: Payer: Self-pay | Admitting: Obstetrics & Gynecology

## 2022-02-23 VITALS — BP 132/87 | HR 98

## 2022-02-23 DIAGNOSIS — Z3A36 36 weeks gestation of pregnancy: Secondary | ICD-10-CM

## 2022-02-23 DIAGNOSIS — I2699 Other pulmonary embolism without acute cor pulmonale: Secondary | ICD-10-CM | POA: Diagnosis not present

## 2022-02-23 DIAGNOSIS — Z0001 Encounter for general adult medical examination with abnormal findings: Secondary | ICD-10-CM | POA: Diagnosis not present

## 2022-02-23 DIAGNOSIS — Z0130 Encounter for examination of blood pressure without abnormal findings: Secondary | ICD-10-CM

## 2022-02-23 DIAGNOSIS — R030 Elevated blood-pressure reading, without diagnosis of hypertension: Secondary | ICD-10-CM | POA: Diagnosis not present

## 2022-02-23 LAB — POCT URINALYSIS DIPSTICK OB
Blood, UA: NEGATIVE
Glucose, UA: NEGATIVE
Ketones, UA: NEGATIVE
Leukocytes, UA: NEGATIVE
Nitrite, UA: NEGATIVE
POC,PROTEIN,UA: NEGATIVE

## 2022-02-23 LAB — CERVICOVAGINAL ANCILLARY ONLY
Chlamydia: NEGATIVE
Comment: NEGATIVE
Comment: NORMAL
Neisseria Gonorrhea: NEGATIVE

## 2022-02-23 NOTE — Progress Notes (Signed)
    NURSE VISIT- NST  SUBJECTIVE:  Shirley Peters is a 32 y.o. G2P1001 female at [redacted]w[redacted]d, here for a NST for pregnancy complicated by [facility].  She reports active fetal movement, contractions: none, vaginal bleeding: none, membranes: intact.   OBJECTIVE:  BP 132/87   Pulse 98   LMP 06/11/2021   Appears well, no apparent distress  Results for orders placed or performed in visit on 02/23/22 (from the past 24 hour(s))  POC Urinalysis Dipstick OB   Collection Time: 02/23/22  9:09 AM  Result Value Ref Range   Color, UA     Clarity, UA     Glucose, UA Negative Negative   Bilirubin, UA     Ketones, UA neg    Spec Grav, UA     Blood, UA neg    pH, UA     POC,PROTEIN,UA Negative Negative, Trace, Small (1+), Moderate (2+), Large (3+), 4+   Urobilinogen, UA     Nitrite, UA neg    Leukocytes, UA Negative Negative   Appearance     Odor    Results for orders placed or performed in visit on 02/22/22 (from the past 24 hour(s))  POC Urinalysis Dipstick OB   Collection Time: 02/22/22 11:58 AM  Result Value Ref Range   Color, UA     Clarity, UA     Glucose, UA Negative Negative   Bilirubin, UA     Ketones, UA neg    Spec Grav, UA     Blood, UA neg    pH, UA     POC,PROTEIN,UA Negative Negative, Trace, Small (1+), Moderate (2+), Large (3+), 4+   Urobilinogen, UA     Nitrite, UA neg    Leukocytes, UA Negative Negative   Appearance     Odor      NST: FHR baseline 140 bpm, Variability: moderate, Accelerations:present, Decelerations:  Absent= Cat 1/reactive Toco: none   ASSESSMENT: G2P1001 at [redacted]w[redacted]d with GHTN NST reactive  PLAN: EFM strip reviewed by Dr. Nelda Marseille   Recommendations:  induction friday     Janece Canterbury  02/23/2022 10:08 AM  NURSE VISIT- BLOOD PRESSURE CHECK  SUBJECTIVE:  Shirley Peters is a 32 y.o. G4P1001 female here for BP check. She is [redacted]w[redacted]d pregnant    HYPERTENSION ROS:  Pregnant/postpartum:  Severe headaches that don't go away with tylenol/other medicines: No   Visual changes (seeing spots/double/blurred vision) No  Severe pain under right breast breast or in center of upper chest No  Severe nausea/vomiting No  Taking medicines as instructed not applicable   OBJECTIVE:  LMP 06/11/2021   Appearance alert, well appearing, and in no distress.  ASSESSMENT: Pregnancy [redacted]w[redacted]d  blood pressure check  PLAN: Discussed with Dr. Nelda Marseille   Recommendations:  nst today    Follow-up:  induction friday    Janece Canterbury  02/23/2022 9:10 AM

## 2022-02-24 ENCOUNTER — Ambulatory Visit: Payer: 59 | Attending: Cardiology | Admitting: Cardiology

## 2022-02-25 ENCOUNTER — Other Ambulatory Visit: Payer: Self-pay

## 2022-02-25 ENCOUNTER — Inpatient Hospital Stay (HOSPITAL_COMMUNITY): Payer: 59 | Admitting: Anesthesiology

## 2022-02-25 ENCOUNTER — Encounter (HOSPITAL_COMMUNITY): Payer: Self-pay | Admitting: Obstetrics & Gynecology

## 2022-02-25 ENCOUNTER — Inpatient Hospital Stay (HOSPITAL_COMMUNITY): Payer: 59

## 2022-02-25 ENCOUNTER — Inpatient Hospital Stay (HOSPITAL_COMMUNITY)
Admission: AD | Admit: 2022-02-25 | Discharge: 2022-02-27 | DRG: 806 | Disposition: A | Discharge status: Home / Self Care | Payer: 59 | Attending: Obstetrics and Gynecology | Admitting: Obstetrics and Gynecology

## 2022-02-25 DIAGNOSIS — Z7901 Long term (current) use of anticoagulants: Secondary | ICD-10-CM | POA: Diagnosis not present

## 2022-02-25 DIAGNOSIS — R002 Palpitations: Secondary | ICD-10-CM | POA: Diagnosis not present

## 2022-02-25 DIAGNOSIS — O99892 Other specified diseases and conditions complicating childbirth: Secondary | ICD-10-CM | POA: Diagnosis not present

## 2022-02-25 DIAGNOSIS — Z87891 Personal history of nicotine dependence: Secondary | ICD-10-CM

## 2022-02-25 DIAGNOSIS — O99824 Streptococcus B carrier state complicating childbirth: Secondary | ICD-10-CM | POA: Diagnosis not present

## 2022-02-25 DIAGNOSIS — O99820 Streptococcus B carrier state complicating pregnancy: Secondary | ICD-10-CM | POA: Diagnosis not present

## 2022-02-25 DIAGNOSIS — Z3480 Encounter for supervision of other normal pregnancy, unspecified trimester: Principal | ICD-10-CM

## 2022-02-25 DIAGNOSIS — O1410 Severe pre-eclampsia, unspecified trimester: Secondary | ICD-10-CM | POA: Diagnosis present

## 2022-02-25 DIAGNOSIS — O99354 Diseases of the nervous system complicating childbirth: Secondary | ICD-10-CM | POA: Diagnosis present

## 2022-02-25 DIAGNOSIS — R000 Tachycardia, unspecified: Secondary | ICD-10-CM | POA: Diagnosis present

## 2022-02-25 DIAGNOSIS — Z86711 Personal history of pulmonary embolism: Secondary | ICD-10-CM

## 2022-02-25 DIAGNOSIS — O1414 Severe pre-eclampsia complicating childbirth: Principal | ICD-10-CM | POA: Diagnosis present

## 2022-02-25 DIAGNOSIS — D649 Anemia, unspecified: Secondary | ICD-10-CM | POA: Diagnosis not present

## 2022-02-25 DIAGNOSIS — O139 Gestational [pregnancy-induced] hypertension without significant proteinuria, unspecified trimester: Secondary | ICD-10-CM | POA: Diagnosis present

## 2022-02-25 DIAGNOSIS — Z3A37 37 weeks gestation of pregnancy: Secondary | ICD-10-CM

## 2022-02-25 DIAGNOSIS — O9902 Anemia complicating childbirth: Secondary | ICD-10-CM | POA: Diagnosis not present

## 2022-02-25 DIAGNOSIS — M419 Scoliosis, unspecified: Secondary | ICD-10-CM | POA: Diagnosis not present

## 2022-02-25 DIAGNOSIS — O134 Gestational [pregnancy-induced] hypertension without significant proteinuria, complicating childbirth: Secondary | ICD-10-CM | POA: Diagnosis not present

## 2022-02-25 DIAGNOSIS — I2699 Other pulmonary embolism without acute cor pulmonale: Secondary | ICD-10-CM | POA: Diagnosis present

## 2022-02-25 HISTORY — DX: Gestational (pregnancy-induced) hypertension without significant proteinuria, unspecified trimester: O13.9

## 2022-02-25 HISTORY — DX: Palpitations: R00.2

## 2022-02-25 HISTORY — DX: Tachycardia, unspecified: R00.0

## 2022-02-25 LAB — COMPREHENSIVE METABOLIC PANEL
ALT: 14 U/L (ref 0–44)
AST: 16 U/L (ref 15–41)
Albumin: 2.5 g/dL — ABNORMAL LOW (ref 3.5–5.0)
Alkaline Phosphatase: 86 U/L (ref 38–126)
Anion gap: 12 (ref 5–15)
BUN: <5 mg/dL — ABNORMAL LOW (ref 6–20)
CO2: 25 mmol/L (ref 22–32)
Calcium: 8.2 mg/dL — ABNORMAL LOW (ref 8.9–10.3)
Chloride: 101 mmol/L (ref 98–111)
Creatinine, Ser: 0.36 mg/dL — ABNORMAL LOW (ref 0.44–1.00)
GFR, Estimated: >60 mL/min (ref >60)
Glucose, Bld: 105 mg/dL — ABNORMAL HIGH (ref 70–99)
Potassium: 2.4 mmol/L — CL (ref 3.5–5.1)
Sodium: 138 mmol/L (ref 135–145)
Total Bilirubin: 0.5 mg/dL (ref 0.3–1.2)
Total Protein: 5.6 g/dL — ABNORMAL LOW (ref 6.5–8.1)

## 2022-02-25 LAB — CBC
HCT: 32.9 % — ABNORMAL LOW (ref 36.0–46.0)
HCT: 32.5 % — ABNORMAL LOW (ref 36.0–46.0)
HCT: 36.4 % (ref 36.0–46.0)
Hemoglobin: 11.3 g/dL — ABNORMAL LOW (ref 12.0–15.0)
Hemoglobin: 11.3 g/dL — ABNORMAL LOW (ref 12.0–15.0)
Hemoglobin: 12.2 g/dL (ref 12.0–15.0)
MCH: 30.4 pg (ref 26.0–34.0)
MCH: 30.3 pg (ref 26.0–34.0)
MCH: 29.9 pg (ref 26.0–34.0)
MCHC: 34.3 g/dL (ref 30.0–36.0)
MCHC: 34.8 g/dL (ref 30.0–36.0)
MCHC: 33.5 g/dL (ref 30.0–36.0)
MCV: 88.4 fL (ref 80.0–100.0)
MCV: 87.1 fL (ref 80.0–100.0)
MCV: 89.2 fL (ref 80.0–100.0)
Platelets: 223 10*3/uL (ref 150–400)
Platelets: 216 10*3/uL (ref 150–400)
Platelets: 241 10*3/uL (ref 150–400)
RBC: 3.72 MIL/uL — ABNORMAL LOW (ref 3.87–5.11)
RBC: 3.73 MIL/uL — ABNORMAL LOW (ref 3.87–5.11)
RBC: 4.08 MIL/uL (ref 3.87–5.11)
RDW: 15 % (ref 11.5–15.5)
RDW: 15.1 % (ref 11.5–15.5)
RDW: 15.1 % (ref 11.5–15.5)
WBC: 7.8 10*3/uL (ref 4.0–10.5)
WBC: 7.1 10*3/uL (ref 4.0–10.5)
WBC: 11.5 10*3/uL — ABNORMAL HIGH (ref 4.0–10.5)
nRBC: 0 % (ref 0.0–0.2)
nRBC: 0 % (ref 0.0–0.2)
nRBC: 0 % (ref 0.0–0.2)

## 2022-02-25 LAB — BASIC METABOLIC PANEL
Anion gap: 11 (ref 5–15)
Anion gap: 5 (ref 5–15)
BUN: <5 mg/dL — ABNORMAL LOW (ref 6–20)
BUN: <5 mg/dL — ABNORMAL LOW (ref 6–20)
CO2: 27 mmol/L (ref 22–32)
CO2: 28 mmol/L (ref 22–32)
Calcium: 8.2 mg/dL — ABNORMAL LOW (ref 8.9–10.3)
Calcium: 7.8 mg/dL — ABNORMAL LOW (ref 8.9–10.3)
Chloride: 100 mmol/L (ref 98–111)
Chloride: 103 mmol/L (ref 98–111)
Creatinine, Ser: 0.4 mg/dL — ABNORMAL LOW (ref 0.44–1.00)
Creatinine, Ser: 0.38 mg/dL — ABNORMAL LOW (ref 0.44–1.00)
GFR, Estimated: >60 mL/min (ref >60)
GFR, Estimated: >60 mL/min (ref >60)
Glucose, Bld: 96 mg/dL (ref 70–99)
Glucose, Bld: 80 mg/dL (ref 70–99)
Potassium: 2.5 mmol/L — CL (ref 3.5–5.1)
Potassium: 2.7 mmol/L — CL (ref 3.5–5.1)
Sodium: 138 mmol/L (ref 135–145)
Sodium: 136 mmol/L (ref 135–145)

## 2022-02-25 LAB — TYPE AND SCREEN
ABO/RH(D): A POS
Antibody Screen: NEGATIVE

## 2022-02-25 LAB — PROTEIN / CREATININE RATIO, URINE
Creatinine, Urine: 38 mg/dL
Protein Creatinine Ratio: 0.18 mg/mg{Cre} — ABNORMAL HIGH (ref 0.00–0.15)
Total Protein, Urine: 7 mg/dL

## 2022-02-25 LAB — RPR: RPR Ser Ql: NONREACTIVE

## 2022-02-25 LAB — MAGNESIUM: Magnesium: 1.6 mg/dL — ABNORMAL LOW (ref 1.7–2.4)

## 2022-02-25 LAB — OB RESULTS CONSOLE RPR: RPR: NONREACTIVE

## 2022-02-25 MED ORDER — IBUPROFEN 600 MG PO TABS
600.0000 mg | ORAL_TABLET | Freq: Four times a day (QID) | ORAL | Status: DC
  Administered 2022-02-25 – 2022-02-27 (×7): 600 mg via ORAL
  Filled 2022-02-25 (×7): qty 1

## 2022-02-25 MED ORDER — PHENYLEPHRINE 80 MCG/ML (10ML) SYRINGE FOR IV PUSH (FOR BLOOD PRESSURE SUPPORT): 80.0000 ug | PREFILLED_SYRINGE | INTRAVENOUS | Status: DC | PRN

## 2022-02-25 MED ORDER — SODIUM BICARBONATE 8.4 % IV SOLN
INTRAVENOUS | Status: DC | PRN
  Administered 2022-02-25: 6 mL via EPIDURAL
  Administered 2022-02-25: 5 mL via EPIDURAL

## 2022-02-25 MED ORDER — FENTANYL CITRATE (PF) 100 MCG/2ML IJ SOLN
INTRAMUSCULAR | Status: DC | PRN
  Administered 2022-02-25: 100 ug via EPIDURAL

## 2022-02-25 MED ORDER — LABETALOL HCL 5 MG/ML IV SOLN: 20.0000 mg | INTRAVENOUS | Status: DC | PRN

## 2022-02-25 MED ORDER — OXYCODONE-ACETAMINOPHEN 5-325 MG PO TABS: 1.0000 | ORAL_TABLET | ORAL | Status: DC | PRN

## 2022-02-25 MED ORDER — ZOLPIDEM TARTRATE 5 MG PO TABS: 5.0000 mg | ORAL_TABLET | Freq: Every evening | ORAL | Status: DC | PRN

## 2022-02-25 MED ORDER — DIBUCAINE (PERIANAL) 1 % EX OINT: 1.0000 | TOPICAL_OINTMENT | CUTANEOUS | Status: DC | PRN

## 2022-02-25 MED ORDER — FENTANYL-BUPIVACAINE-NACL 0.5-0.125-0.9 MG/250ML-% EP SOLN
EPIDURAL | Status: DC | PRN
  Administered 2022-02-25: 12 mL/h via EPIDURAL

## 2022-02-25 MED ORDER — LACTATED RINGERS IV SOLN: INTRAVENOUS | Status: DC

## 2022-02-25 MED ORDER — BUPIVACAINE HCL (PF) 0.25 % IJ SOLN
INTRAMUSCULAR | Status: DC | PRN
  Administered 2022-02-25: 1.3 mL via INTRATHECAL

## 2022-02-25 MED ORDER — POTASSIUM CHLORIDE 10 MEQ/100ML IV SOLN
10.0000 meq | INTRAVENOUS | Status: AC
  Administered 2022-02-25 (×4): 10 meq via INTRAVENOUS
  Filled 2022-02-25 (×4): qty 100

## 2022-02-25 MED ORDER — ACETAMINOPHEN 325 MG PO TABS
650.0000 mg | ORAL_TABLET | ORAL | Status: DC | PRN
  Administered 2022-02-27 (×2): 650 mg via ORAL
  Filled 2022-02-25 (×2): qty 2

## 2022-02-25 MED ORDER — BUPIVACAINE HCL (PF) 0.25 % IJ SOLN
INTRAMUSCULAR | Status: DC | PRN
  Administered 2022-02-25: 4 mL via EPIDURAL

## 2022-02-25 MED ORDER — CEFAZOLIN SODIUM-DEXTROSE 2-4 GM/100ML-% IV SOLN: 2.0000 g | Freq: Four times a day (QID) | INTRAVENOUS | Status: DC

## 2022-02-25 MED ORDER — MAGNESIUM SULFATE 2 GM/50ML IV SOLN
2.0000 g | Freq: Once | INTRAVENOUS | Status: AC
  Administered 2022-02-25: 2 g via INTRAVENOUS
  Filled 2022-02-25: qty 50

## 2022-02-25 MED ORDER — LIDOCAINE-EPINEPHRINE (PF) 2 %-1:200000 IJ SOLN
INTRAMUSCULAR | Status: DC | PRN
  Administered 2022-02-25: 4 mL via EPIDURAL

## 2022-02-25 MED ORDER — FENTANYL-BUPIVACAINE-NACL 0.5-0.125-0.9 MG/250ML-% EP SOLN
12.0000 mL/h | EPIDURAL | Status: DC | PRN
  Filled 2022-02-25: qty 250

## 2022-02-25 MED ORDER — TETANUS-DIPHTH-ACELL PERTUSSIS 5-2.5-18.5 LF-MCG/0.5 IM SUSY: 0.5000 mL | PREFILLED_SYRINGE | Freq: Once | INTRAMUSCULAR | Status: DC

## 2022-02-25 MED ORDER — CEFAZOLIN SODIUM-DEXTROSE 2-4 GM/100ML-% IV SOLN
2.0000 g | Freq: Once | INTRAVENOUS | Status: AC
  Administered 2022-02-25: 2 g via INTRAVENOUS
  Filled 2022-02-25: qty 100

## 2022-02-25 MED ORDER — COCONUT OIL OIL
1.0000 | TOPICAL_OIL | Status: DC | PRN
  Administered 2022-02-27: 1 via TOPICAL

## 2022-02-25 MED ORDER — FENTANYL CITRATE (PF) 100 MCG/2ML IJ SOLN
50.0000 ug | INTRAMUSCULAR | Status: DC | PRN
  Administered 2022-02-25 (×2): 100 ug via INTRAVENOUS
  Filled 2022-02-25 (×3): qty 2

## 2022-02-25 MED ORDER — LIDOCAINE HCL (PF) 1 % IJ SOLN
INTRAMUSCULAR | Status: DC | PRN
  Administered 2022-02-25: 10 mL via EPIDURAL
  Administered 2022-02-25: 2 mL via EPIDURAL

## 2022-02-25 MED ORDER — ONDANSETRON HCL 4 MG/2ML IJ SOLN: 4.0000 mg | INTRAMUSCULAR | Status: DC | PRN

## 2022-02-25 MED ORDER — EPHEDRINE 5 MG/ML INJ: 10.0000 mg | INTRAVENOUS | Status: DC | PRN

## 2022-02-25 MED ORDER — POTASSIUM CHLORIDE CRYS ER 20 MEQ PO TBCR
40.0000 meq | EXTENDED_RELEASE_TABLET | Freq: Two times a day (BID) | ORAL | Status: DC
  Administered 2022-02-25 – 2022-02-27 (×5): 40 meq via ORAL
  Filled 2022-02-25 (×6): qty 2

## 2022-02-25 MED ORDER — LABETALOL HCL 5 MG/ML IV SOLN: 40.0000 mg | INTRAVENOUS | Status: DC | PRN

## 2022-02-25 MED ORDER — HYDRALAZINE HCL 20 MG/ML IJ SOLN: 10.0000 mg | INTRAMUSCULAR | Status: DC | PRN

## 2022-02-25 MED ORDER — PRENATAL MULTIVITAMIN CH
1.0000 | ORAL_TABLET | Freq: Every day | ORAL | Status: DC
  Administered 2022-02-26 – 2022-02-27 (×2): 1 via ORAL
  Filled 2022-02-25 (×2): qty 1

## 2022-02-25 MED ORDER — MAGNESIUM SULFATE BOLUS VIA INFUSION
4.0000 g | Freq: Once | INTRAVENOUS | Status: AC
  Administered 2022-02-25: 4 g via INTRAVENOUS
  Filled 2022-02-25: qty 1000

## 2022-02-25 MED ORDER — OXYTOCIN-SODIUM CHLORIDE 30-0.9 UT/500ML-% IV SOLN
1.0000 m[IU]/min | INTRAVENOUS | Status: DC
  Administered 2022-02-25: 2 m[IU]/min via INTRAVENOUS
  Filled 2022-02-25: qty 500

## 2022-02-25 MED ORDER — BENZOCAINE-MENTHOL 20-0.5 % EX AERO: 1.0000 | INHALATION_SPRAY | CUTANEOUS | Status: DC | PRN

## 2022-02-25 MED ORDER — LABETALOL HCL 5 MG/ML IV SOLN: 80.0000 mg | INTRAVENOUS | Status: DC | PRN

## 2022-02-25 MED ORDER — ONDANSETRON HCL 4 MG/2ML IJ SOLN: 4.0000 mg | Freq: Four times a day (QID) | INTRAMUSCULAR | Status: DC | PRN

## 2022-02-25 MED ORDER — LACTATED RINGERS IV SOLN
500.0000 mL | Freq: Once | INTRAVENOUS | Status: AC
  Administered 2022-02-25: 500 mL via INTRAVENOUS

## 2022-02-25 MED ORDER — LACTATED RINGERS IV SOLN: 500.0000 mL | INTRAVENOUS | Status: DC | PRN

## 2022-02-25 MED ORDER — OXYTOCIN BOLUS FROM INFUSION
333.0000 mL | Freq: Once | INTRAVENOUS | Status: AC
  Administered 2022-02-25: 333 mL via INTRAVENOUS

## 2022-02-25 MED ORDER — MISOPROSTOL 25 MCG QUARTER TABLET
25.0000 ug | ORAL_TABLET | Freq: Once | ORAL | Status: AC
  Administered 2022-02-25: 25 ug via ORAL
  Filled 2022-02-25: qty 1

## 2022-02-25 MED ORDER — OXYTOCIN-SODIUM CHLORIDE 30-0.9 UT/500ML-% IV SOLN
2.5000 [IU]/h | INTRAVENOUS | Status: DC
  Administered 2022-02-25: 2.5 [IU]/h via INTRAVENOUS

## 2022-02-25 MED ORDER — OXYCODONE-ACETAMINOPHEN 5-325 MG PO TABS: 2.0000 | ORAL_TABLET | ORAL | Status: DC | PRN

## 2022-02-25 MED ORDER — WITCH HAZEL-GLYCERIN EX PADS: 1.0000 | MEDICATED_PAD | CUTANEOUS | Status: DC | PRN

## 2022-02-25 MED ORDER — MAGNESIUM SULFATE 40 GM/1000ML IV SOLN
1.0000 g/h | INTRAVENOUS | Status: AC
  Administered 2022-02-25: 1 g/h via INTRAVENOUS
  Filled 2022-02-25: qty 1000

## 2022-02-25 MED ORDER — CEFAZOLIN SODIUM-DEXTROSE 1-4 GM/50ML-% IV SOLN
1.0000 g | Freq: Three times a day (TID) | INTRAVENOUS | Status: DC
  Administered 2022-02-25: 1 g via INTRAVENOUS
  Filled 2022-02-25 (×3): qty 50

## 2022-02-25 MED ORDER — MISOPROSTOL 25 MCG QUARTER TABLET
25.0000 ug | ORAL_TABLET | Freq: Once | ORAL | Status: AC
  Administered 2022-02-25: 25 ug via VAGINAL
  Filled 2022-02-25: qty 1

## 2022-02-25 MED ORDER — LIDOCAINE HCL (PF) 1 % IJ SOLN: 30.0000 mL | INTRAMUSCULAR | Status: DC | PRN

## 2022-02-25 MED ORDER — TERBUTALINE SULFATE 1 MG/ML IJ SOLN: 0.2500 mg | Freq: Once | INTRAMUSCULAR | Status: DC | PRN

## 2022-02-25 MED ORDER — SOD CITRATE-CITRIC ACID 500-334 MG/5ML PO SOLN: 30.0000 mL | ORAL | Status: DC | PRN

## 2022-02-25 MED ORDER — DIPHENHYDRAMINE HCL 25 MG PO CAPS: 25.0000 mg | ORAL_CAPSULE | Freq: Four times a day (QID) | ORAL | Status: DC | PRN

## 2022-02-25 MED ORDER — SIMETHICONE 80 MG PO CHEW: 80.0000 mg | CHEWABLE_TABLET | ORAL | Status: DC | PRN

## 2022-02-25 MED ORDER — NIFEDIPINE ER OSMOTIC RELEASE 30 MG PO TB24: 30.0000 mg | ORAL_TABLET | Freq: Every day | ORAL | Status: DC

## 2022-02-25 MED ORDER — ONDANSETRON HCL 4 MG PO TABS: 4.0000 mg | ORAL_TABLET | ORAL | Status: DC | PRN

## 2022-02-25 MED ORDER — POTASSIUM CHLORIDE CRYS ER 20 MEQ PO TBCR
40.0000 meq | EXTENDED_RELEASE_TABLET | Freq: Once | ORAL | Status: AC
  Administered 2022-02-25: 40 meq via ORAL
  Filled 2022-02-25: qty 2

## 2022-02-25 MED ORDER — FENTANYL CITRATE (PF) 100 MCG/2ML IJ SOLN
INTRAMUSCULAR | Status: AC
  Filled 2022-02-25: qty 2

## 2022-02-25 MED ORDER — ENOXAPARIN SODIUM 40 MG/0.4ML IJ SOSY
40.0000 mg | PREFILLED_SYRINGE | INTRAMUSCULAR | Status: DC
  Administered 2022-02-26 – 2022-02-27 (×2): 40 mg via SUBCUTANEOUS
  Filled 2022-02-25 (×2): qty 0.4

## 2022-02-25 MED ORDER — ACETAMINOPHEN 325 MG PO TABS: 650.0000 mg | ORAL_TABLET | ORAL | Status: DC | PRN

## 2022-02-25 MED ORDER — FENTANYL CITRATE (PF) 100 MCG/2ML IJ SOLN
INTRAMUSCULAR | Status: DC | PRN
  Administered 2022-02-25: 100 ug via INTRAVENOUS

## 2022-02-25 MED ORDER — SENNOSIDES-DOCUSATE SODIUM 8.6-50 MG PO TABS
2.0000 | ORAL_TABLET | ORAL | Status: DC
  Administered 2022-02-26: 2 via ORAL
  Filled 2022-02-25 (×2): qty 2

## 2022-02-25 MED ORDER — FUROSEMIDE 20 MG PO TABS
20.0000 mg | ORAL_TABLET | Freq: Every day | ORAL | Status: DC
  Administered 2022-02-26: 20 mg via ORAL
  Filled 2022-02-25: qty 1

## 2022-02-25 MED ORDER — DIPHENHYDRAMINE HCL 50 MG/ML IJ SOLN: 12.5000 mg | INTRAMUSCULAR | Status: DC | PRN

## 2022-02-25 NOTE — Anesthesia Postprocedure Evaluation (Signed)
Anesthesia Post Note  Patient: Shirley Peters  Procedure(s) Performed: AN AD HOC LABOR EPIDURAL     Patient location during evaluation: Mother Baby Anesthesia Type: Epidural Level of consciousness: awake and alert Pain management: pain level controlled Vital Signs Assessment: post-procedure vital signs reviewed and stable Respiratory status: spontaneous breathing, nonlabored ventilation and respiratory function stable Cardiovascular status: stable Postop Assessment: no headache, no backache and epidural receding Anesthetic complications: no   No notable events documented.  Last Vitals:  Vitals:   02/25/22 2130 02/25/22 2219  BP: (!) 158/95 (!) 150/90  Pulse: 91 90  Resp:  19  Temp:    SpO2:      Last Pain:  Vitals:   02/25/22 2155  TempSrc:   PainSc: 0-No pain   Pain Goal:                   [name]

## 2022-02-25 NOTE — Anesthesia Preprocedure Evaluation (Addendum)
Anesthesia Evaluation  Patient identified by MRN, date of birth, ID band Patient awake    Reviewed: Allergy & Precautions, H&P , NPO status , Patient's Chart, lab work & pertinent test results  Airway Mallampati: II  TM Distance: >3 FB Neck ROM: Full    Dental no notable dental hx.    Pulmonary former smoker, PE (LD lovenox >48h ago)   Pulmonary exam normal breath sounds clear to auscultation       Cardiovascular hypertension (gest HTN), Normal cardiovascular exam Rhythm:Regular Rate:Normal     Neuro/Psych negative neurological ROS  negative psych ROS   GI/Hepatic negative GI ROS, Neg liver ROS,,,  Endo/Other  K 2.5 this AM, has been repleted  Renal/GU negative Renal ROS  negative genitourinary   Musculoskeletal Harrington rods @32yo$  from T1-L2   Abdominal   Peds negative pediatric ROS (+)  Hematology  (+) Blood dyscrasia, anemia Hb 11.3, plt 216   Anesthesia Other Findings   Reproductive/Obstetrics (+) Pregnancy Prior epidural w/ last delivery patchy per pt, no records here                              Anesthesia Physical Anesthesia Plan  ASA: 3  Anesthesia Plan: Epidural   Post-op Pain Management:    Induction:   PONV Risk Score and Plan: 2  Airway Management Planned: Natural Airway  Additional Equipment: None  Intra-op Plan:   Post-operative Plan:   Informed Consent: I have reviewed the patients History and Physical, chart, labs and discussed the procedure including the risks, benefits and alternatives for the proposed anesthesia with the patient or authorized representative who has indicated his/her understanding and acceptance.       Plan Discussed with:   Anesthesia Plan Comments: (Long discussion with patient regarding possibility of failed epidural, patchy pain control 2/2 extensive back surgery in the past. Last epidural was >10 years ago but per patient she  was never fully comfortable with that epidural during labor. She is aware of the risks and wishes to proceed.)         Anesthesia Quick Evaluation

## 2022-02-25 NOTE — Progress Notes (Signed)
Patient had several severe range blood pressures. Per ANMD, Dr. Doroteo Glassman, epidural was to be replaced and a spinal dose given. ANMD requested that RN hold labetalol treatment for blood pressures, as spinal anesthesia would likely lower blood pressure. Dr. Caron Presume was made aware and agreeable to this plan. Spinal anesthesia was given and blood pressures did lower below severe range.

## 2022-02-25 NOTE — Plan of Care (Signed)
  Problem: Education: Goal: Knowledge of Childbirth will improve Outcome: Adequate for Discharge Goal: Ability to make informed decisions regarding treatment and plan of care will improve Outcome: Adequate for Discharge Goal: Ability to state and carry out methods to decrease the pain will improve Outcome: Adequate for Discharge Goal: Individualized Educational Video(s) Outcome: Not Applicable   Problem: Coping: Goal: Ability to verbalize concerns and feelings about labor and delivery will improve Outcome: Adequate for Discharge   Problem: Life Cycle: Goal: Ability to make normal progression through stages of labor will improve Outcome: Completed/Met Goal: Ability to effectively push during vaginal delivery will improve Outcome: Completed/Met   Problem: Role Relationship: Goal: Will demonstrate positive interactions with the child Outcome: Adequate for Discharge   Problem: Safety: Goal: Risk of complications during labor and delivery will decrease Outcome: Adequate for Discharge   Problem: Pain Management: Goal: Relief or control of pain from uterine contractions will improve Outcome: Adequate for Discharge   

## 2022-02-25 NOTE — Progress Notes (Signed)
Labor Progress Note Shirley OBRION is a 32 y.o. G2P1001 at 74w0dpresented for IOL for Gestational HTN S: Pain somewhat controlled with epidural in place.  Tearful.  Reports she is emotional.  Denies any preeclampsia symptoms.  O:  BP (!) 177/99   Pulse 87   Temp 98.8 F (37.1 C) (Oral)   Resp 16   Ht 5' 7"$  (1.702 m)   Wt 101.2 kg   LMP 06/11/2021   SpO2 94%   BMI 34.93 kg/m  EFM: 135/6-25/none  CVE: Dilation: 4 Effacement (%): 70 Cervical Position: Middle Station: -2 Presentation: Vertex Exam by:: Dr. CObie Dredge  A&P: 32y.o. G2P1001 338w0d#Labor: AROM ~3:55PM, clear fluid noted.  #Pain: Epidural #FWB: Cat 1 fetal tracing #GBS positive - receiving cefazolin Preeclampsia: Severe range blood pressures, most recent 165/101.  Labetalol protocol initiated and patient started on magnesium.  JoConcepcion LivingMD [facility] 4:33 PM

## 2022-02-25 NOTE — Anesthesia Procedure Notes (Addendum)
Epidural Patient location during procedure: OB Start time: 02/25/2022 4:55 PM End time: 02/25/2022 5:05 PM  Staffing Anesthesiologist: Pervis Hocking, DO Performed: anesthesiologist   Preanesthetic Checklist Completed: patient identified, IV checked, risks and benefits discussed, monitors and equipment checked, pre-op evaluation and timeout performed  Epidural Patient position: sitting Prep: DuraPrep and site prepped and draped Patient monitoring: continuous pulse ox, blood pressure, heart rate and cardiac monitor Approach: midline Location: L4-L5 Injection technique: LOR air  Needle:  Needle type: Tuohy  Needle gauge: 17 G Needle length: 9 cm Needle insertion depth: 7 cm Catheter type: closed end flexible Catheter size: 19 Gauge Catheter at skin depth: 12 cm Test dose: negative  Assessment Sensory level: T8 Events: blood not aspirated, no cerebrospinal fluid, injection not painful, no injection resistance, no paresthesia and negative IV test  Additional Notes Patient identified. Risks/Benefits/Options discussed with patient including but not limited to bleeding, infection, nerve damage, paralysis, failed block, incomplete pain control, headache, blood pressure changes, nausea, vomiting, reactions to medication both or allergic, itching and postpartum back pain. Confirmed with bedside nurse the patient's most recent platelet count. Confirmed with patient that they are not currently taking any anticoagulation, have any bleeding history or any family history of bleeding disorders. Patient expressed understanding and wished to proceed. All questions were answered. Sterile technique was used throughout the entire procedure. Please see nursing notes for vital signs. Test dose was given through epidural catheter and negative prior to continuing to dose epidural or start infusion. Warning signs of high block given to the patient including shortness of breath, tingling/numbness in  hands, complete motor block, or any concerning symptoms with instructions to call for help. Patient was given instructions on fall risk and not to get out of bed. All questions and concerns addressed with instructions to call with any issues or inadequate analgesia.    CSE performed with 24G sprotte through tuohy, clear CSF no issuesReason for block:procedure for pain

## 2022-02-25 NOTE — Discharge Summary (Signed)
Postpartum Discharge Summary  Date of Service updated***     Patient Name: Shirley Peters DOB: 01/22/1990 MRN: [id number]  Date of admission: 02/25/2022 Delivery date:02/25/2022  Delivering provider: Concepcion Living  Date of discharge: 02/25/2022  Admitting diagnosis: Gestational hypertension [O13.9] Intrauterine pregnancy: [redacted]w[redacted]d    Secondary diagnosis:  Principal Problem:   Gestational hypertension Active Problems:   [place]ulmonary embolus ([place]   Tachycardia & palpitations  Additional problems: ***    Discharge diagnosis: Term Pregnancy Delivered and Preeclampsia (severe)                                              Post partum procedures:{Postpartum procedures:23558} Augmentation: AROM, Pitocin, and Cytotec Complications: None  Hospital course: Induction of Labor With Vaginal Delivery   32y.o. yo G2P1001 at 326w0das admitted to the hospital 02/25/2022 for induction of labor.  Indication for induction: Gestational hypertension.  Patient had an labor course complicated by progression to preeclampsia w/ SF started on mag.   Membrane Rupture Time/Date: 3:51 PM ,02/25/2022   Delivery Method:Vaginal, Spontaneous  Episiotomy: None  Lacerations:  1st degree;Perineal  Details of delivery can be found in separate delivery note.  Patient had a postpartum course complicated by***. Patient is discharged home 02/25/22.  Newborn Data: Birth date:02/25/2022  Birth time:7:48 PM  Gender:Female  Living status:Living  Apgars:9 ,9  Weight:   Magnesium Sulfate received: Yes: Seizure prophylaxis BMZ received: No Rhophylac:N/A MMR:No T-DaP:Given prenatally Flu: No Transfusion:{Transfusion received:30440034}  Physical exam  Vitals:   02/25/22 1915 02/25/22 1921 02/25/22 2000 02/25/22 2015  BP: (!) 146/82 (!) 158/89 (!) 147/92 (!) 155/92  Pulse: 97 100 100 99  Resp: 18 16    Temp:      TempSrc:      SpO2: 94%     Weight:      Height:       General: {Exam;  general:21111117} Lochia: {Desc; appropriate/inappropriate:30686::"appropriate"} Uterine Fundus: {Desc; firm/soft:30687} Incision: {Exam; incision:21111123} DVT Evaluation: {Exam; dvt:2111122} Labs: Lab Results  Component Value Date   WBC 7.1 02/25/2022   HGB 11.3 (L) 02/25/2022   HCT 32.5 (L) 02/25/2022   MCV 87.1 02/25/2022   PLT 216 02/25/2022      Latest Ref Rng & Units 02/25/2022    3:32 PM  CMP  Glucose 70 - 99 mg/dL 80   BUN 6 - 20 mg/dL <5   Creatinine 0.44 - 1.00 mg/dL 0.38   Sodium 135 - 145 mmol/L 136   Potassium 3.5 - 5.1 mmol/L 2.7   Chloride 98 - 111 mmol/L 103   CO2 22 - 32 mmol/L 28   Calcium 8.9 - 10.3 mg/dL 7.8    Edinburgh Score:     No data to display           After visit meds:  Allergies as of 02/25/2022       Reactions   Penicillins    Patient doesn't know her reaction, states that she has been told she is allergic since she was young     Med Rec must be completed [facility]        Discharge home in stable condition Infant Feeding: {Baby feeding:23562} Infant Disposition:{CHL IP OB HOME WITH MOOP:[id number] instruction: per After Visit Summary and Postpartum booklet. Activity: Advance as tolerated. Pelvic rest for 6 weeks.  Diet: {  OB BY:[id number] Future Appointments: Future Appointments  Date Time Provider [place]  03/24/2022 11:00 AM Satira Sark, MD [facility]   Follow up Visit: Message sent   Please schedule this patient for a In person postpartum visit in 4 weeks with the following provider: Any provider. Additional Postpartum F/U:BP check 1 week  High risk pregnancy complicated by: HTN Delivery mode:  Vaginal, Spontaneous  Anticipated Birth Control:  POPs   02/25/2022 Concepcion Living, MD

## 2022-02-25 NOTE — Progress Notes (Addendum)
Labor Progress Note FRANSISCA BURNINGHAM is a 32 y.o. G2P1001 at 60w0dpresented for IOL for Gestational HTN S: Patient is resting comfortably. Discussed AROM and pt is agreeable.  O:  BP (!) 149/91   Pulse 77   Temp 98.8 F (37.1 C) (Oral)   Resp 14   Ht 5' 7"$  (1.702 m)   Wt 101.2 kg   LMP 06/11/2021   SpO2 94%   BMI 34.93 kg/m  EFM: 135/6-25/none  CVE: Dilation: 4 Effacement (%): 70 Cervical Position: Middle Station: -2 Presentation: Vertex Exam by:: Dr. CObie Dredge  A&P: 32y.o. G2P1001 333w0d#Labor: AROM ~3:55PM, clear fluid noted.  #Pain: Epidural #FWB: Cat 1 fetal tracing #GBS positive - receiving cefazolin   JaArlyce DiceMD [facility]:58 PM

## 2022-02-25 NOTE — Progress Notes (Signed)
Critical potassium value of 2.7 given by phone to Lenox Ponds RN

## 2022-02-25 NOTE — Plan of Care (Signed)
  Problem: Education: Goal: Knowledge of General Education information will improve Description: Including pain rating scale, medication(s)/side effects and non-pharmacologic comfort measures Outcome: Progressing   Problem: Health Behavior/Discharge Planning: Goal: Ability to manage health-related needs will improve Outcome: Progressing   Problem: Clinical Measurements: Goal: Ability to maintain clinical measurements within normal limits will improve Outcome: Progressing Goal: Will remain free from infection Outcome: Progressing Goal: Diagnostic test results will improve Outcome: Progressing Goal: Respiratory complications will improve Outcome: Progressing Goal: Cardiovascular complication will be avoided Outcome: Progressing   Problem: Activity: Goal: Risk for activity intolerance will decrease Outcome: Progressing   Problem: Nutrition: Goal: Adequate nutrition will be maintained Outcome: Progressing   Problem: Coping: Goal: Level of anxiety will decrease Outcome: Progressing   Problem: Elimination: Goal: Will not experience complications related to bowel motility Outcome: Progressing Goal: Will not experience complications related to urinary retention Outcome: Progressing   Problem: Pain Managment: Goal: General experience of comfort will improve Outcome: Progressing   Problem: Safety: Goal: Ability to remain free from injury will improve Outcome: Progressing   Problem: Skin Integrity: Goal: Risk for impaired skin integrity will decrease Outcome: Progressing   Problem: Education: Goal: Knowledge of disease or condition will improve Outcome: Progressing Goal: Knowledge of the prescribed therapeutic regimen will improve Outcome: Progressing   Problem: Fluid Volume: Goal: Peripheral tissue perfusion will improve Outcome: Progressing   Problem: Clinical Measurements: Goal: Complications related to disease process, condition or treatment will be avoided or  minimized Outcome: Progressing   Problem: Education: Goal: Knowledge of condition will improve Outcome: Progressing Goal: Individualized Educational Video(s) Outcome: Progressing Goal: Individualized Newborn Educational Video(s) Outcome: Progressing   Problem: Activity: Goal: Will verbalize the importance of balancing activity with adequate rest periods Outcome: Progressing Goal: Ability to tolerate increased activity will improve Outcome: Progressing   Problem: Coping: Goal: Ability to identify and utilize available resources and services will improve Outcome: Progressing   Problem: Life Cycle: Goal: Chance of risk for complications during the postpartum period will decrease Outcome: Progressing   Problem: Role Relationship: Goal: Ability to demonstrate positive interaction with newborn will improve Outcome: Progressing   Problem: Skin Integrity: Goal: Demonstration of wound healing without infection will improve Outcome: Progressing   

## 2022-02-25 NOTE — Anesthesia Procedure Notes (Addendum)
Epidural Patient location during procedure: OB Start time: 02/25/2022 2:57 PM End time: 02/25/2022 3:12 PM  Staffing Anesthesiologist: Pervis Hocking, DO Performed: anesthesiologist   Preanesthetic Checklist Completed: patient identified, IV checked, risks and benefits discussed, monitors and equipment checked, pre-op evaluation and timeout performed  Epidural Patient position: sitting Prep: DuraPrep and site prepped and draped Patient monitoring: continuous pulse ox, blood pressure, heart rate and cardiac monitor Approach: midline Location: L3-L4 Injection technique: LOR air  Needle:  Needle type: Tuohy  Needle gauge: 17 G Needle length: 9 cm Needle insertion depth: 9 cm Catheter type: closed end flexible Catheter size: 19 Gauge Catheter at skin depth: 15 cm Test dose: negative  Assessment Sensory level: T8 Events: blood not aspirated, no cerebrospinal fluid, injection not painful, no injection resistance, no paresthesia and negative IV test  Additional Notes Patient identified. Risks/Benefits/Options discussed with patient including but not limited to bleeding, infection, nerve damage, paralysis, failed block, incomplete pain control, headache, blood pressure changes, nausea, vomiting, reactions to medication both or allergic, itching and postpartum back pain. Confirmed with bedside nurse the patient's most recent platelet count. Confirmed with patient that they are not currently taking any anticoagulation, have any bleeding history or any family history of bleeding disorders. Patient expressed understanding and wished to proceed. All questions were answered. Sterile technique was used throughout the entire procedure. Please see nursing notes for vital signs. Test dose was given through epidural catheter and negative prior to continuing to dose epidural or start infusion. Warning signs of high block given to the patient including shortness of breath, tingling/numbness in  hands, complete motor block, or any concerning symptoms with instructions to call for help. Patient was given instructions on fall risk and not to get out of bed. All questions and concerns addressed with instructions to call with any issues or inadequate analgesia.     Difficult placement at one level d/t harrington rods from T1-L2Reason for block:procedure for pain

## 2022-02-25 NOTE — Lactation Note (Signed)
This note was copied from a baby's chart. Lactation Consultation Note  Patient Name: Shirley Peters [id number] Date: 02/25/2022 Reason for consult: L&D Initial assessment;Early term 41-38.6wks (Birth Parent with Amelia Jo will be on [facility] on [place]) Age:34 hours Birth Parent latched infant on her right breast using the football hold position, infant sustained latch and was still breastfeeding after 15 minutes. Birth Parent will continue to BF infant according to hunger cues, on demand, 8+ times within 24 hours, STS. Birth Parent knows to ask RN/LC for further latch assistance if needed.  Maternal Data Does the patient have breastfeeding experience prior to this delivery?: Yes How long did the patient breastfeed?: Per Birth Parent, 1st child is 53 years old and she only BF 1st child for 2 days due latch difficulties, she gave up.  Feeding Mother's Current Feeding Choice: Breast Milk  LATCH Score Latch: Grasps breast easily, tongue down, lips flanged, rhythmical sucking.  Audible Swallowing: Spontaneous and intermittent  Type of Nipple: Everted at rest and after stimulation  Comfort (Breast/Nipple): Soft / non-tender  Hold (Positioning): Assistance needed to correctly position infant at breast and maintain latch.  LATCH Score: 9   Lactation Tools Discussed/Used    Interventions Interventions: Adjust position;Support pillows;Position options;Skin to skin;Assisted with latch;Education;Breast compression;Breast massage  Discharge    Consult Status Consult Status: Follow-up from L&D    Eulis Canner 02/25/2022, 8:52 PM

## 2022-02-25 NOTE — Anesthesia Postprocedure Evaluation (Signed)
Anesthesia Post Note  Patient: Shirley Peters  Procedure(s) Performed: AN AD HOC LABOR EPIDURAL     Patient location during evaluation: Mother Baby Anesthesia Type: Epidural Level of consciousness: awake and alert Pain management: pain level controlled Vital Signs Assessment: post-procedure vital signs reviewed and stable Respiratory status: spontaneous breathing, nonlabored ventilation and respiratory function stable Cardiovascular status: stable Postop Assessment: no headache, no backache and epidural receding Anesthetic complications: no   No notable events documented.  Last Vitals:  Vitals:   02/25/22 2130 02/25/22 2219  BP: (!) 158/95 (!) 150/90  Pulse: 91 90  Resp:  19  Temp:    SpO2:      Last Pain:  Vitals:   02/25/22 2155  TempSrc:   PainSc: 0-No pain   Pain Goal:                   [name]

## 2022-02-25 NOTE — H&P (Signed)
Shirley Peters is a 32 y.o. female presenting for Induction of Labor for New Gestational Hypertension diagnosed 01/22/22   Has had occasional headaches but does not have one today.  Has had some intermittent contractions.  CNM Office Note:  Today she reports  elevated home bp's since Sat, 140s-150s/80s. Did have an isolated SBP of 160 this am, but was 140s on recheck. Some occ headaches, not bad enough to take apap- they eventually go away on their own. No visual changes. Some nausea, no vomiting. Increased swelling. . Contractions: Irritability. Vag. Bleeding: None.  Movement: Present. denies leaking of fluid.  OB History     Gravida  2   Para  1   Term  1   Preterm      AB      Living  1      SAB      IAB      Ectopic      Multiple      Live Births  1          Past Medical History:  Diagnosis Date   Palpitations    Pregnancy induced hypertension    Pulmonary embolus ([place])    November 2021   Scoliosis    Tachycardia    Past Surgical History:  Procedure Laterality Date   BACK SURGERY     age 60   WISDOM TOOTH EXTRACTION  Jan 2014   Family History: family history includes Alcohol abuse in her maternal grandfather and paternal grandfather; Breast cancer in her paternal grandmother; Cancer in her father; Diabetes in her father, maternal grandmother, and paternal grandmother; Hypertension in her maternal grandmother and paternal grandmother; Kidney cancer in her maternal grandmother; Parkinson's disease in her father; Pulmonary embolism in her mother. Social History:  reports that she quit smoking about 2 years ago. Her smoking use included e-cigarettes. She has never used smokeless tobacco. She reports that she does not currently use alcohol. She reports that she does not use drugs.     Maternal Diabetes: No Genetic Screening: Normal Maternal Ultrasounds/Referrals: Normal Fetal Ultrasounds or other Referrals:  None Maternal Substance Abuse:  No Significant  Maternal Medications:  None Significant Maternal Lab Results:  Group B Strep positive Number of Prenatal Visits:greater than 3 verified prenatal visits Other Comments:  None  Review of Systems  Constitutional:  Negative for chills and fever.  Eyes:  Negative for visual disturbance.  Respiratory:  Negative for shortness of breath.   Gastrointestinal:  Negative for abdominal pain, diarrhea and nausea.  Genitourinary:  Negative for pelvic pain, vaginal bleeding and vaginal discharge.  Neurological:  Negative for weakness and headaches.   Maternal Medical History:  Reason for admission: Nausea. IOL for Gestational Hypertension  Contractions: Frequency: irregular.   Perceived severity is mild.   Fetal activity: Perceived fetal activity is normal.   Last perceived fetal movement was within the past hour.   Prenatal complications: PIH.   No IUGR, placental abnormality or pre-eclampsia.   Prenatal Complications - Diabetes: none.   Dilation: 2.5 Effacement (%): 50, 60 Station: -2 Exam by:: Shirley Peters CNM Last menstrual period 06/11/2021. Maternal Exam:  Uterine Assessment: Contraction strength is mild.  Contraction frequency is irregular.  Abdomen: Patient reports no abdominal tenderness. Fetal presentation: vertex Introitus: Normal vulva. Normal vagina.  Pelvis: adequate for delivery.   Cervix: Cervix evaluated by digital exam.     Fetal Exam Fetal Monitor Review: Mode: ultrasound.   Variability: moderate (6-25 bpm).   Pattern: accelerations present  and no decelerations.   Fetal State Assessment: Category I - tracings are normal.   Physical Exam Constitutional:      General: She is not in acute distress.    Appearance: She is not ill-appearing or toxic-appearing.  HENT:     Head: Normocephalic.  Cardiovascular:     Rate and Rhythm: Normal rate.  Pulmonary:     Effort: Pulmonary effort is normal.  Abdominal:     General: There is no distension.     Palpations: There is  no mass.     Tenderness: There is no abdominal tenderness. There is no guarding.  Genitourinary:    General: Normal vulva.  Musculoskeletal:        General: Normal range of motion.     Cervical back: Normal range of motion.  Skin:    General: Skin is warm and dry.  Neurological:     General: No focal deficit present.     Mental Status: She is alert.  Psychiatric:        Mood and Affect: Mood normal.     Prenatal labs: ABO, Rh: --/--/PENDING (02/16 0710) Antibody: PENDING (02/16 0710) Rubella: 1.65 (08/30 0841) RPR: Non Reactive (12/11 0815)  HBsAg: Negative (08/30 0841)  HIV: Non Reactive (12/11 0815)  GBS: --/Positive (02/13 1400)   Assessment/Plan: [place] IUP at 76w0dGestational Hypertension Scoliosis  Admit to Labor and Delivery Routine orders Cytotec PO/Vag    MHansel Feinstein2/16/2024, 7:51 AM

## 2022-02-26 LAB — BASIC METABOLIC PANEL
Anion gap: 8 (ref 5–15)
BUN: <5 mg/dL — ABNORMAL LOW (ref 6–20)
CO2: 25 mmol/L (ref 22–32)
Calcium: 7.7 mg/dL — ABNORMAL LOW (ref 8.9–10.3)
Chloride: 102 mmol/L (ref 98–111)
Creatinine, Ser: 0.41 mg/dL — ABNORMAL LOW (ref 0.44–1.00)
GFR, Estimated: >60 mL/min (ref >60)
Glucose, Bld: 104 mg/dL — ABNORMAL HIGH (ref 70–99)
Potassium: 2.8 mmol/L — ABNORMAL LOW (ref 3.5–5.1)
Sodium: 135 mmol/L (ref 135–145)

## 2022-02-26 MED ORDER — METOPROLOL SUCCINATE ER 50 MG PO TB24
50.0000 mg | ORAL_TABLET | Freq: Every day | ORAL | Status: DC
  Administered 2022-02-26 – 2022-02-27 (×2): 50 mg via ORAL
  Filled 2022-02-26 (×2): qty 1

## 2022-02-26 MED ORDER — NIFEDIPINE ER OSMOTIC RELEASE 60 MG PO TB24
60.0000 mg | ORAL_TABLET | Freq: Every day | ORAL | Status: DC
  Administered 2022-02-26: 60 mg via ORAL
  Filled 2022-02-26: qty 1

## 2022-02-26 NOTE — Lactation Note (Signed)
This note was copied from a baby's chart. Lactation Consultation Note  Patient Name: Shirley Peters [id number] Date: 02/26/2022 Age : 34 hours  Reason for consult: Initial assessment;Mother's request;Early term 37-38.6wks;Infant weight loss (2 % weight loss) Per mom last fed at 1140 =5 ml .  LC offered to assist to latch and mom receptive.  LC changed a small loose black stool.  LC noted areola edema and recommended reverse pressure.  And LC showed mom to make the nipple / areola compressible for a deep latch. Areola compressible prior to latch.  [place] Placed baby STS on the right breast , football with depth and fed for 34 minutes with multiple swallows and nipple only slightly slanted at the tip of the nipple.  Baby satisfied after feeding.  LC provided written LC step plan to prevent soreness and to help with a deeper latch.    Maternal Data Has patient been taught Hand Expression?: Yes Does the patient have breastfeeding experience prior to this delivery?: Yes How long did the patient breastfeed?: per mom a few days  Feeding Mother's Current Feeding Choice: Breast Milk and Donor Milk  LATCH Score Latch: Repeated attempts needed to sustain latch, nipple held in mouth throughout feeding, stimulation needed to elicit sucking reflex.  Audible Swallowing: Spontaneous and intermittent  Type of Nipple: Everted at rest and after stimulation  Comfort (Breast/Nipple): Soft / non-tender  Hold (Positioning): Assistance needed to correctly position infant at breast and maintain latch.  LATCH Score: 8   Lactation Tools Discussed/Used Tools: Pump;Shells;Flanges Flange Size: 21;24 Breast pump type: Double-Electric Breast Pump;Manual Pump Education: Setup, frequency, and cleaning;Milk Storage Pumped volume:  (drops)  Interventions Interventions: Breast feeding basics reviewed;Assisted with latch;Skin to skin;Breast massage;Hand express;Reverse pressure;Breast compression;Adjust  position;Support pillows;Position options;Shells;Hand pump;DEBP;Education;LC Services brochure  Discharge Pump: Personal;Hands Free;DEBP;Manual  Consult Status Consult Status: Follow-up Date: 02/27/22 Follow-up type: In-patient    [place] 02/26/2022, 3:14 PM

## 2022-02-26 NOTE — Progress Notes (Signed)
Post Partum Day 1 s/p SVD at 37w Subjective: up ad lib, voiding, and tolerating PO. Doing well overall, fatigued.   Objective: Blood pressure (!) 143/90, pulse 79, temperature 98.2 F (36.8 C), temperature source Oral, resp. rate 18, height 5' 7"$  (1.702 m), weight 101.2 kg, last menstrual period 06/11/2021, SpO2 98 %, unknown if currently breastfeeding.  Physical Exam:  General: alert and no distress Lochia: appropriate Uterine Fundus: firm DVT Evaluation: No evidence of DVT seen on physical exam.  Recent Labs    02/25/22 1326 02/25/22 2109  HGB 11.3* 12.2  HCT 32.5* 36.4    Assessment/Plan: Postpartum - Contraception: POPs - MOF: breast - Rh status: Rh+ - Rubella status: RI - Dispo: anticipate discharge home PPD2 - Consults: lactation  Neonatal - Doing well at bedside  3. Preeclampsia w/ SF (BP) - asymptomatic - BP 140-150s/90s on procardia XL 23m - increase to procardia XL 636mdaily - UOP adequate - lasix 2027maily x 5d - continue Mag 4/2 x 24h PP  4. History of PE - pLov resumed this morning, continue x 6w PP  5. Palpitations - continue home metoprolol 28m42m6. Hypokalemia - K 2.8 this AM, rpt tomorrow AM - PO K 40 mEq BID ordered   LOS: 1 day   KymbInez Catalina7/2024, 12:54 PM

## 2022-02-27 LAB — BASIC METABOLIC PANEL
Anion gap: 8 (ref 5–15)
BUN: <5 mg/dL — ABNORMAL LOW (ref 6–20)
CO2: 28 mmol/L (ref 22–32)
Calcium: 8.2 mg/dL — ABNORMAL LOW (ref 8.9–10.3)
Chloride: 103 mmol/L (ref 98–111)
Creatinine, Ser: 0.34 mg/dL — ABNORMAL LOW (ref 0.44–1.00)
GFR, Estimated: >60 mL/min (ref >60)
Glucose, Bld: 69 mg/dL — ABNORMAL LOW (ref 70–99)
Potassium: 2.9 mmol/L — ABNORMAL LOW (ref 3.5–5.1)
Sodium: 139 mmol/L (ref 135–145)

## 2022-02-27 LAB — STREP GP B SUSCEPTIBILITY

## 2022-02-27 LAB — MAGNESIUM: Magnesium: 1.8 mg/dL (ref 1.7–2.4)

## 2022-02-27 LAB — STREP GP B NAA+RFLX: Strep Gp B NAA+Rflx: POSITIVE — AB

## 2022-02-27 MED ORDER — NIFEDIPINE ER OSMOTIC RELEASE 60 MG PO TB24
90.0000 mg | ORAL_TABLET | Freq: Every day | ORAL | Status: DC
  Administered 2022-02-27: 90 mg via ORAL
  Filled 2022-02-27: qty 1

## 2022-02-27 MED ORDER — POTASSIUM CHLORIDE 10 MEQ/100ML IV SOLN
10.0000 meq | INTRAVENOUS | Status: AC
  Administered 2022-02-27 (×4): 10 meq via INTRAVENOUS
  Filled 2022-02-27 (×4): qty 100

## 2022-02-27 MED ORDER — FUROSEMIDE 20 MG PO TABS: 20.0000 mg | ORAL_TABLET | Freq: Once | ORAL | Status: DC

## 2022-02-27 MED ORDER — POTASSIUM CHLORIDE CRYS ER 20 MEQ PO TBCR
40.0000 meq | EXTENDED_RELEASE_TABLET | Freq: Two times a day (BID) | ORAL | 0 refills | Status: DC
  Filled 2022-02-27: qty 6, 2d supply, fill #0

## 2022-02-27 MED ORDER — ACETAMINOPHEN 325 MG PO TABS
650.0000 mg | ORAL_TABLET | ORAL | 0 refills | Status: DC | PRN
  Filled 2022-02-27: qty 60, 5d supply, fill #0

## 2022-02-27 MED ORDER — IBUPROFEN 600 MG PO TABS
600.0000 mg | ORAL_TABLET | Freq: Four times a day (QID) | ORAL | 0 refills | Status: DC
  Filled 2022-02-27: qty 30, 8d supply, fill #0

## 2022-02-27 MED ORDER — FUROSEMIDE 20 MG PO TABS
20.0000 mg | ORAL_TABLET | Freq: Every day | ORAL | 0 refills | Status: DC
  Filled 2022-02-27: qty 3, 3d supply, fill #0

## 2022-02-27 MED ORDER — LABETALOL HCL 100 MG PO TABS
200.0000 mg | ORAL_TABLET | Freq: Three times a day (TID) | ORAL | 1 refills | Status: DC
  Filled 2022-02-27: qty 90, 15d supply, fill #0

## 2022-02-27 MED ORDER — NIFEDIPINE ER OSMOTIC RELEASE 90 MG PO TB24
90.0000 mg | ORAL_TABLET | Freq: Every day | ORAL | 1 refills | Status: AC
  Filled 2022-02-27: qty 30, 30d supply, fill #0

## 2022-02-27 NOTE — Lactation Note (Signed)
This note was copied from a baby's chart. Lactation Consultation Note  Patient Name: Shirley Peters [id number] Date: 02/27/2022 Reason for consult: Follow-up assessment;Early term 37-38.6wks;Infant weight loss (10 % weight loss/ post MagSo4) Age:34 hours LC reviewed the [place] plan from this [place] provided yesterday to mom for home use.  Per  mom breast are getting heavier and nipples feeling sore. LC recommended using her DEBP for several pumps and just bottle feed to give her nipples a break. Resume latching later this evening following the steps from [facility] yesterday for latching.  [place] Reviewed BF D/C teaching and mom receptive to coming back for [facility] appt.   Maternal Data    Feeding Mother's Current Feeding Choice: Breast Milk and Donor Milk  LATCH Score - per mom baby has been breast feeding well and started the supplementing this am like the LC recommended.     Lactation Tools Discussed/Used Tools: Shells;Pump Flange Size: 21;24 Breast pump type: Manual;Double-Electric Breast Pump Pump Education: Milk Storage;Setup, frequency, and cleaning  Interventions Interventions: Breast feeding basics reviewed;Education;DEBP;LC Services brochure  Discharge Discharge Education: Engorgement and breast care;Warning signs for feeding baby;Outpatient recommendation;Outpatient Epic message sent;Other (comment) (mom aware she will receive a call from the [facility] O/P) Pump: Personal;Hands Free;DEBP;Manual  Consult Status Consult Status: Complete Date: 02/27/22    Myer Haff 02/27/2022, 3:31 PM

## 2022-02-27 NOTE — Plan of Care (Signed)
  Problem: Education: Goal: Knowledge of General Education information will improve Description: Including pain rating scale, medication(s)/side effects and non-pharmacologic comfort measures Outcome: Completed/Met   Problem: Health Behavior/Discharge Planning: Goal: Ability to manage health-related needs will improve Outcome: Completed/Met   Problem: Clinical Measurements: Goal: Ability to maintain clinical measurements within normal limits will improve Outcome: Completed/Met Goal: Will remain free from infection Outcome: Completed/Met Goal: Diagnostic test results will improve Outcome: Completed/Met Goal: Respiratory complications will improve Outcome: Completed/Met Goal: Cardiovascular complication will be avoided Outcome: Completed/Met   Problem: Activity: Goal: Risk for activity intolerance will decrease Outcome: Completed/Met   Problem: Nutrition: Goal: Adequate nutrition will be maintained Outcome: Completed/Met   Problem: Coping: Goal: Level of anxiety will decrease Outcome: Completed/Met   Problem: Elimination: Goal: Will not experience complications related to bowel motility Outcome: Completed/Met Goal: Will not experience complications related to urinary retention Outcome: Completed/Met   Problem: Pain Managment: Goal: General experience of comfort will improve Outcome: Completed/Met   Problem: Safety: Goal: Ability to remain free from injury will improve Outcome: Completed/Met   Problem: Skin Integrity: Goal: Risk for impaired skin integrity will decrease Outcome: Completed/Met   Problem: Education: Goal: Knowledge of disease or condition will improve Outcome: Completed/Met Goal: Knowledge of the prescribed therapeutic regimen will improve Outcome: Completed/Met   Problem: Fluid Volume: Goal: Peripheral tissue perfusion will improve Outcome: Completed/Met   Problem: Clinical Measurements: Goal: Complications related to disease process,  condition or treatment will be avoided or minimized Outcome: Completed/Met   Problem: Education: Goal: Knowledge of condition will improve Outcome: Completed/Met Goal: Individualized Educational Video(s) Outcome: Completed/Met Goal: Individualized Newborn Educational Video(s) Outcome: Completed/Met   Problem: Activity: Goal: Will verbalize the importance of balancing activity with adequate rest periods Outcome: Completed/Met Goal: Ability to tolerate increased activity will improve Outcome: Completed/Met   Problem: Coping: Goal: Ability to identify and utilize available resources and services will improve Outcome: Completed/Met   Problem: Life Cycle: Goal: Chance of risk for complications during the postpartum period will decrease Outcome: Completed/Met   Problem: Role Relationship: Goal: Ability to demonstrate positive interaction with newborn will improve Outcome: Completed/Met   Problem: Skin Integrity: Goal: Demonstration of wound healing without infection will improve Outcome: Completed/Met   

## 2022-02-28 ENCOUNTER — Encounter: Payer: 59 | Admitting: Advanced Practice Midwife

## 2022-02-28 ENCOUNTER — Other Ambulatory Visit (HOSPITAL_COMMUNITY): Payer: Self-pay

## 2022-03-01 ENCOUNTER — Other Ambulatory Visit (HOSPITAL_COMMUNITY): Payer: Self-pay

## 2022-03-02 ENCOUNTER — Encounter: Payer: 59 | Admitting: Advanced Practice Midwife

## 2022-03-02 ENCOUNTER — Other Ambulatory Visit: Payer: 59

## 2022-03-02 ENCOUNTER — Other Ambulatory Visit (HOSPITAL_COMMUNITY): Payer: Self-pay

## 2022-03-04 ENCOUNTER — Ambulatory Visit: Payer: 59 | Admitting: *Deleted

## 2022-03-04 VITALS — BP 118/73 | HR 86

## 2022-03-04 DIAGNOSIS — Z0130 Encounter for examination of blood pressure without abnormal findings: Secondary | ICD-10-CM

## 2022-03-04 NOTE — Progress Notes (Signed)
   NURSE VISIT- BLOOD PRESSURE CHECK  SUBJECTIVE:  Shirley Peters is a 32 y.o. G95P2002 female here for BP check. She is postpartum, delivery date 02/25/22     HYPERTENSION ROS:  Pregnant/postpartum:  Severe headaches that don't go away with tylenol/other medicines: No  Visual changes (seeing spots/double/blurred vision) No  Severe pain under right breast breast or in center of upper chest No  Severe nausea/vomiting No  Taking medicines as instructed yes   OBJECTIVE:  BP 118/73 (BP Location: Right Arm, Patient Position: Sitting, Cuff Size: Normal)   Pulse 86   Appearance alert, well appearing, and in no distress.  ASSESSMENT: Postpartum  blood pressure check  PLAN: Discussed with Derrill Memo, CNM   Recommendations:  stop labetalol, continue procardia    Follow-up:  1 week for RN BP Check    Shirley Peters  03/04/2022 11:38 AM

## 2022-03-08 ENCOUNTER — Encounter: Payer: 59 | Admitting: Women's Health

## 2022-03-08 DIAGNOSIS — O1490 Unspecified pre-eclampsia, unspecified trimester: Secondary | ICD-10-CM | POA: Diagnosis not present

## 2022-03-11 ENCOUNTER — Telehealth (INDEPENDENT_AMBULATORY_CARE_PROVIDER_SITE_OTHER): Payer: 59 | Admitting: *Deleted

## 2022-03-11 VITALS — BP 128/88

## 2022-03-11 DIAGNOSIS — Z0130 Encounter for examination of blood pressure without abnormal findings: Secondary | ICD-10-CM

## 2022-03-11 NOTE — Progress Notes (Signed)
   NURSE VISIT- BLOOD PRESSURE CHECK  I connected with Shirley Peters on 03/11/2022 by MyChart video and verified that I am speaking with the correct person using two identifiers.   I discussed the limitations of evaluation and management by telemedicine. The patient expressed understanding and agreed to proceed.  Nurse is at the office, and patient is at home.  SUBJECTIVE:  Shirley Peters is a 32 y.o. G18P2002 female here for BP check. She is postpartum, delivery date 02/25/22     HYPERTENSION ROS:  Pregnant/postpartum:  Severe headaches that don't go away with tylenol/other medicines: No  Visual changes (seeing spots/double/blurred vision) No  Severe pain under right breast breast or in center of upper chest No  Severe nausea/vomiting No  Taking medicines as instructed no   OBJECTIVE:  BP (!) 123/92   Appearance alert, well appearing, and in no distress.  ASSESSMENT: Postpartum  blood pressure check Patient was told to stop labetalol at her last visit and continue procardia. Pt stopped both meds. Discussed with Derrill Memo, pt to restart procardia and followup as planned for PP visit  PLAN: Discussed with Derrill Memo, CNM   Recommendations: stop medicine 2 days before next visit   Follow-up: as scheduled   Janece Canterbury  03/11/2022 11:04 AM

## 2022-03-16 ENCOUNTER — Encounter: Payer: 59 | Admitting: Advanced Practice Midwife

## 2022-03-23 NOTE — Progress Notes (Unsigned)
    Cardiology Office Note  Date: 03/23/2022   ID: Shirley Peters, DOB Jan 02, 1991, MRN [id number]  History of Present Illness: Shirley Peters is a 32 y.o. female last assessed by Dr. Harriet Masson in November 2023, I reviewed the note.  Physical Exam: VS:  There were no vitals taken for this visit., BMI There is no height or weight on file to calculate BMI.  Wt Readings from Last 3 Encounters:  02/25/22 223 lb (101.2 kg)  02/22/22 224 lb (101.6 kg)  02/11/22 216 lb 12.8 oz (98.3 kg)    General: Patient appears comfortable at rest. HEENT: Conjunctiva and lids normal, oropharynx clear with moist mucosa. Neck: Supple, no elevated JVP or carotid bruits, no thyromegaly. Lungs: Clear to auscultation, nonlabored breathing at rest. Cardiac: Regular rate and rhythm, no S3 or significant systolic murmur, no pericardial rub. Abdomen: Soft, nontender, no hepatomegaly, bowel sounds present, no guarding or rebound. Extremities: No pitting edema, distal pulses 2+. Skin: Warm and dry. Musculoskeletal: No kyphosis. Neuropsychiatric: Alert and oriented x3, affect grossly appropriate.  ECG:  An ECG dated 12/02/2019 was personally reviewed today and demonstrated:  Sinus rhythm with probable left atrial enlargement, rightward axis.  Labwork: 02/25/2022: ALT 14; AST 16; Hemoglobin 12.2; Platelets 241 02/27/2022: BUN <5; Creatinine, Ser 0.34; Magnesium 1.8; Potassium 2.9; Sodium 139   Other Studies Reviewed Today:  Echocardiogram 11/16/2021:  1. Left ventricular ejection fraction, by estimation, is 60 to 65%. The  left ventricle has normal function. The left ventricle has no regional  wall motion abnormalities. Left ventricular diastolic parameters were  normal.   2. Right ventricular systolic function is normal. The right ventricular  size is normal. There is normal pulmonary artery systolic pressure.   3. The mitral valve is normal in structure. No evidence of mitral valve  regurgitation. No evidence of  mitral stenosis.   4. The aortic valve is normal in structure. Aortic valve regurgitation is  not visualized. No aortic stenosis is present.   5. The inferior vena cava is normal in size with greater than 50%  respiratory variability, suggesting right atrial pressure of 3 mmHg.   Cardiac monitor November 2023: Patch Wear Time:  5 days and 13 hours (2023-10-28T22:50:09-398 to 2023-11-03T11:50:32-0400)   Patient had a min HR of 73 bpm, max HR of 150 bpm, and avg HR of 96 bpm. Predominant underlying rhythm was Sinus Rhythm. 2 Supraventricular Tachycardia runs occurred, the run with the fastest interval lasting 5 beats with a max rate of 150 bpm (avg 135 bpm); the run with the fastest interval was also the longest.    Isolated SVEs were rare (<1.0%), SVE Couplets were rare (<1.0%), and SVE Triplets were rare (<1.0%). Isolated VEs were frequent (10.5%, 80273), VE Couplets were rare (<1.0%, 314), and no VE  Triplets were present. Ventricular Bigeminy and Trigeminy were present.   Symptoms associated with premature ventricular complex.   Conclusion: This study is remarkable for frequent premature ventricular complexes (10.5%, [id number]).  Assessment and Plan:  Frequent PVCs.  Disposition:  Follow up {follow up:15908}  Signed, Satira Sark, M.D., F.A.C.C.

## 2022-03-24 ENCOUNTER — Encounter: Payer: Self-pay | Admitting: Cardiology

## 2022-03-24 ENCOUNTER — Ambulatory Visit: Payer: 59 | Attending: Cardiology | Admitting: Cardiology

## 2022-03-24 VITALS — BP 130/78 | HR 92 | Ht 67.0 in | Wt 186.0 lb

## 2022-03-24 DIAGNOSIS — I493 Ventricular premature depolarization: Secondary | ICD-10-CM

## 2022-03-24 NOTE — Patient Instructions (Signed)
Medication Instructions:  Your physician recommends that you continue on your current medications as directed. Please refer to the Current Medication list given to you today.   Labwork: None today  Testing/Procedures: None today  Follow-Up: 6 months  Any Other Special Instructions Will Be Listed Below (If Applicable).  If you need a refill on your cardiac medications before your next appointment, please call your pharmacy.  

## 2022-04-04 ENCOUNTER — Encounter: Payer: Self-pay | Admitting: Women's Health

## 2022-04-04 ENCOUNTER — Ambulatory Visit (INDEPENDENT_AMBULATORY_CARE_PROVIDER_SITE_OTHER): Payer: 59 | Admitting: Women's Health

## 2022-04-04 ENCOUNTER — Other Ambulatory Visit (HOSPITAL_COMMUNITY)
Admission: RE | Admit: 2022-04-04 | Discharge: 2022-04-04 | Disposition: A | Discharge status: Home / Self Care | Payer: 59 | Source: Ambulatory Visit | Attending: Women's Health | Admitting: Women's Health

## 2022-04-04 DIAGNOSIS — Z3202 Encounter for pregnancy test, result negative: Secondary | ICD-10-CM

## 2022-04-04 DIAGNOSIS — F53 Postpartum depression: Secondary | ICD-10-CM | POA: Diagnosis not present

## 2022-04-04 DIAGNOSIS — Z124 Encounter for screening for malignant neoplasm of cervix: Secondary | ICD-10-CM | POA: Diagnosis not present

## 2022-04-04 LAB — POCT URINE PREGNANCY: Preg Test, Ur: NEGATIVE

## 2022-04-04 MED ORDER — SLYND 4 MG PO TABS: 1.0000 | ORAL_TABLET | Freq: Every day | ORAL | 3 refills | Status: AC

## 2022-04-04 NOTE — Progress Notes (Signed)
POSTPARTUM VISIT Patient name: Shirley Peters MRN [id number]  Date of birth: 1990/03/09 Chief Complaint:   Postpartum Care  History of Present Illness:   Shirley Peters is a 32 y.o. G44P2002 Caucasian female being seen today for a postpartum visit. She is 5 weeks postpartum following a spontaneous vaginal delivery at 37.0 gestational weeks. IOL: yes, for gestational hypertension>severe pre-e. Anesthesia: epidural.  Laceration: 1st degree.  Complications: pp severe pre-e. Inpatient contraception: no.   Pregnancy complicated by [facility] pre-e, h/o PE on Lovenox during pregnancy . Tobacco use: no. Substance use disorder: no. Last pap smear: 04/10/19 and results were NILM w/ HRHPV negative. Next pap smear due: now No LMP recorded.  Postpartum course has been complicated by HTN d/c'd on nifedipine 90mg  daily, labetalol 200mg  TID (metoprolol for tachycardia/palpitations she was on during pregnancy d/c'd for this), lasix 20mg  x 5d. Only took labetalol for a few days as it dropped her too low. Stopped nifedipine on Friday. Marland Kitchen Has not checked home bp. Some green d/c the other day, none since, slightly abnormal odor. No itching/irritation. Bleeding scant staining. Bowel function is normal. Bladder function is normal. Urinary incontinence? no, fecal incontinence? no Patient is not sexually active. Last sexual activity: prior to birth of baby. Desired contraception: POPs. Patient does want a pregnancy in the future.  Desired family size is 3 children.  Saw her cardiologist and they discussed she may need to resume metoprolol at some point, next appt 18mths, was told to notify them if tachycardia/palpitations returned.  Stopped Lovenox already- discussed it is recommended for 6wks pp (which would be this Friday 3/29).  Upstream - 04/04/22 0909       Pregnancy Intention Screening   Does the patient want to become pregnant in the next year? No    Does the patient's partner want to become pregnant in the next  year? No    Would the patient like to discuss contraceptive options today? Yes      Contraception Wrap Up   Current Method Abstinence    Contraception Counseling Provided No            The pregnancy intention screening data noted above was reviewed. Potential methods of contraception were discussed. The patient elected to proceed with No data recorded.  Edinburgh Postpartum Depression Screening: negative, however she feels she is having some PPD, was on celexa in past from PCP and worked well for her. Denies SI/HI/II. Sleeping/eating well, still finds joy in things she used to.   Edinburgh Postnatal Depression Scale - 04/04/22 0910       Edinburgh Postnatal Depression Scale:  In the Past 7 Days   I have been able to laugh and see the funny side of things. 0    I have looked forward with enjoyment to things. 0    I have blamed myself unnecessarily when things went wrong. 0    I have been anxious or worried for no good reason. 1    I have felt scared or panicky for no good reason. 1    Things have been getting on top of me. 0    I have been so unhappy that I have had difficulty sleeping. 0    I have felt sad or miserable. 1    I have been so unhappy that I have been crying. 0    The thought of harming myself has occurred to me. 0    Edinburgh Postnatal Depression Scale Total 3  09/06/2021   10:10 AM 03/22/2021    3:01 PM 04/10/2019    9:26 AM  GAD 7 : Generalized Anxiety Score  Nervous, Anxious, on Edge 0 0 0  Control/stop worrying 0 0 0  Worry too much - different things 0 1 0  Trouble relaxing 0 1 0  Restless 0 0 0  Easily annoyed or irritable 1 1 1   Afraid - awful might happen 0 0 0  Total GAD 7 Score 1 3 1   Anxiety Difficulty   Not difficult at all     Baby's course has been uncomplicated. Baby is feeding by breast: milk supply adequate. Infant has a pediatrician/family doctor? Yes.  Childcare strategy if returning to work/school: family.  Pt has  material needs met for her and baby: Yes.   Review of Systems:   Pertinent items are noted in HPI Denies Abnormal vaginal discharge w/ itching/odor/irritation, headaches, visual changes, shortness of breath, chest pain, abdominal pain, severe nausea/vomiting, or problems with urination or bowel movements. Pertinent History Reviewed:  Reviewed past medical,surgical, obstetrical and family history.  Reviewed problem list, medications and allergies. OB History  Gravida Para Term Preterm AB Living  2 2 2     2   SAB IAB Ectopic Multiple Live Births        0 2    # Outcome Date GA Lbr Len/2nd Weight Sex Delivery Anes PTL Lv  2 Term 02/25/22 [redacted]w[redacted]d 05:10 / 00:08 7 lb 0.2 oz (3.18 kg) F Vag-Spont EPI  LIV  1 Term 02/15/08 [redacted]w[redacted]d  9 lb 5 oz (4.224 kg) M Vag-Spont EPI N LIV   Physical Assessment:   Vitals:   04/04/22 0908 04/04/22 0915  BP: (!) 145/90 131/85  Pulse: 88 94  Weight: 191 lb (86.6 kg)   Height: 5\' 7"  (1.702 m)   Body mass index is 29.91 kg/m.       Physical Examination:   General appearance: alert, well appearing, and in no distress  Mental status: alert, oriented to person, place, and time  Skin: warm & dry   Cardiovascular: normal heart rate noted   Respiratory: normal respiratory effort, no distress   Breasts: deferred, no complaints   Abdomen: soft, non-tender   Pelvic: VULVA: normal appearing vulva with no masses, tenderness or lesions, VAGINA: normal appearing vagina with normal color and discharge, no lesions, CERVIX: normal appearing cervix without discharge or lesions. Thin prep pap obtained: Yes  Rectal: not examined  Extremities: Edema: none   Chaperone: Celene Squibb         Results for orders placed or performed in visit on 04/04/22 (from the past 24 hour(s))  POCT urine pregnancy   Collection Time: 04/04/22  9:46 AM  Result Value Ref Range   Preg Test, Ur Negative Negative    Assessment & Plan:  1) Postpartum exam 2) 5 wks s/p spontaneous vaginal  delivery after IOL for [facility] pre-e 3) breast feeding 4) Depression screening 5) Contraception management: rx Slynd to [facility] 6) Vaginal d/c w/ odor> will see if anything shows on pap 7) Cervical cancer screening> pap today 8) PPD> EPDS 3, states she does feel she has some PPD, was on celexa in past (worked best) from PCP, interested in resuming. Discussed she can make appt w/ PCP or [facility]. Opted for [facility], filled out questions in office, appt this Friday. If any issues let us/PCP know.  9) PPHTN> 1st bp elevated, 2nd wnl. To check home bp's daily-BID, if >140/90 let us know. Discussed  w/ Dr. Nelda Marseille who agrees. F/U w/ PCP w/in 3-82mths. Keep cardiologist appt for 7mths 10) H/O provoked PE> was on COCs, on Lovenox during pregnancy, supposed to continue until 6wks pp which would be this Friday 3/29, has already stopped taking  Essential components of care per ACOG recommendations:  1.  Mood and well being:  If positive depression screen, discussed and plan developed.  If using tobacco we discussed reduction/cessation and risk of relapse If current substance abuse, we discussed and referral to local resources was offered.   2. Infant care and feeding:  If breastfeeding, discussed returning to work, pumping, breastfeeding-associated pain, guidance regarding return to fertility while lactating if not using another method. If needed, patient was provided with a letter to be allowed to pump q 2-3hrs to support lactation in a private location with access to a refrigerator to store breastmilk.   Recommended that all caregivers be immunized for flu, pertussis and other preventable communicable diseases If pt does not have material needs met for her/baby, referred to local resources for help obtaining these.  3. Sexuality, contraception and birth spacing Provided guidance regarding sexuality, management of dyspareunia, and resumption of intercourse Discussed avoiding interpregnancy interval  <20mths and recommended birth spacing of 18 months  4. Sleep and fatigue Discussed coping options for fatigue and sleep disruption Encouraged family/partner/community support of 4 hrs of uninterrupted sleep to help with mood and fatigue  5. Physical recovery  If pt had a C/S, assessed incisional pain and providing guidance on normal vs prolonged recovery If pt had a laceration, perineal healing and pain reviewed.  If urinary or fecal incontinence, discussed management and referred to PT or uro/gyn if indicated  Patient is safe to resume physical activity. Discussed attainment of healthy weight.  6.  Chronic disease management Discussed pregnancy complications if any, and their implications for future childbearing and long-term maternal health. Review recommendations for prevention of recurrent pregnancy complications, such as 17 hydroxyprogesterone caproate to reduce risk for recurrent PTB not applicable, or aspirin to reduce risk of preeclampsia yes. Pt had GDM: no. If yes, 2hr GTT scheduled: not applicable. Reviewed medications and non-pregnant dosing including consideration of whether pt is breastfeeding using a reliable resource such as LactMed: yes Referred for f/u w/ PCP or subspecialist providers as indicated: yes  7. Health maintenance Mammogram at 34yo or earlier if indicated Pap smears as indicated  Meds:  Meds ordered this encounter  Medications   Drospirenone (SLYND) 4 MG TABS    Sig: Take 1 tablet (4 mg total) by mouth daily.    Dispense:  90 tablet    Refill:  3    Follow-up: Return in about 1 year (around 04/04/2023) for Physical.   Orders Placed This Encounter  Procedures   POCT urine pregnancy    [place], [facility] 04/04/2022 11:38 AM

## 2022-04-06 LAB — CYTOLOGY - PAP
Comment: NEGATIVE
Diagnosis: NEGATIVE
Diagnosis: REACTIVE
High risk HPV: NEGATIVE

## 2022-04-08 ENCOUNTER — Telehealth (HOSPITAL_COMMUNITY): Payer: Self-pay

## 2022-04-08 ENCOUNTER — Other Ambulatory Visit (HOSPITAL_COMMUNITY): Payer: Self-pay

## 2022-04-08 DIAGNOSIS — F411 Generalized anxiety disorder: Secondary | ICD-10-CM | POA: Diagnosis not present

## 2022-04-08 MED ORDER — CITALOPRAM HYDROBROMIDE 20 MG PO TABS
ORAL_TABLET | ORAL | 0 refills | Status: AC
  Filled 2022-04-08: qty 30, 34d supply, fill #0

## 2022-04-08 NOTE — Telephone Encounter (Signed)
Preadmission testing 

## 2022-04-27 ENCOUNTER — Encounter: Payer: Self-pay | Admitting: Women's Health

## 2022-04-27 ENCOUNTER — Other Ambulatory Visit: Payer: Self-pay | Admitting: Advanced Practice Midwife

## 2022-04-27 MED ORDER — SLYND 4 MG PO TABS: 1.0000 | ORAL_TABLET | Freq: Every day | ORAL | 3 refills | Status: AC

## 2022-05-08 ENCOUNTER — Other Ambulatory Visit (HOSPITAL_COMMUNITY): Payer: Self-pay

## 2022-05-09 ENCOUNTER — Other Ambulatory Visit (HOSPITAL_COMMUNITY): Payer: Self-pay

## 2022-05-09 ENCOUNTER — Other Ambulatory Visit: Payer: Self-pay

## 2022-05-09 MED ORDER — CITALOPRAM HYDROBROMIDE 20 MG PO TABS
10.0000 mg | ORAL_TABLET | ORAL | 0 refills | Status: AC
  Filled 2022-05-09: qty 30, 30d supply, fill #0

## 2022-05-17 IMAGING — US US EXTREM LOW VENOUS
1 series · 14 of 24 positions shown · non-contrast
Comparison: None.

CLINICAL DATA: PE.  Shortness of breath

EXAM:
BILATERAL LOWER EXTREMITY VENOUS DOPPLER ULTRASOUND
TECHNIQUE: Gray-scale sonography with compression, as well as color and duplex
ultrasound, were performed to evaluate the deep venous system(s)
from the level of the common femoral vein through the popliteal and
proximal calf veins.

[Series 1: us venous img lower bilat (dvt) · portal-venous · 14 of 61 slices shown]
[im 1/61]
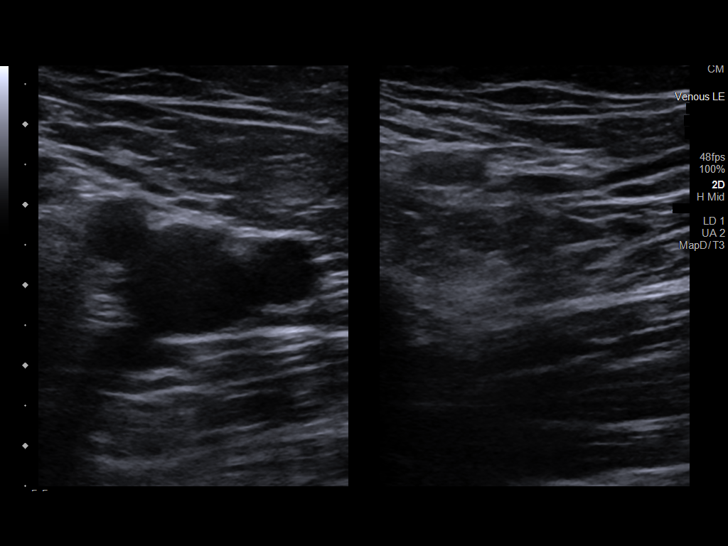
[im 6/61]
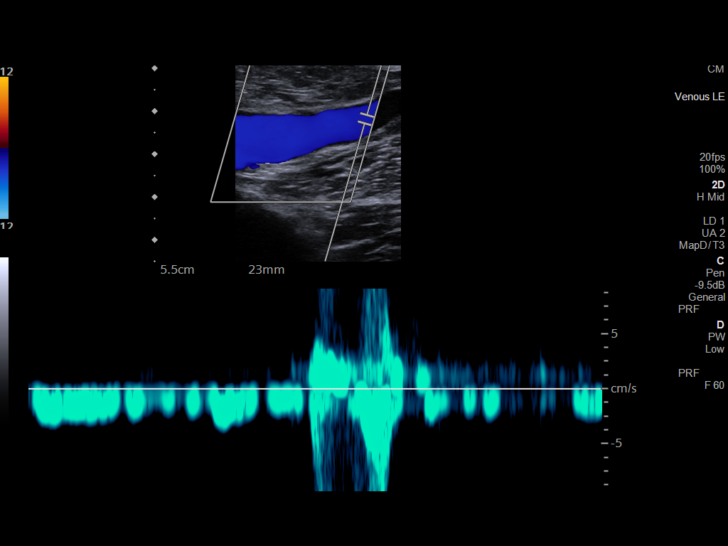
[im 11/61]
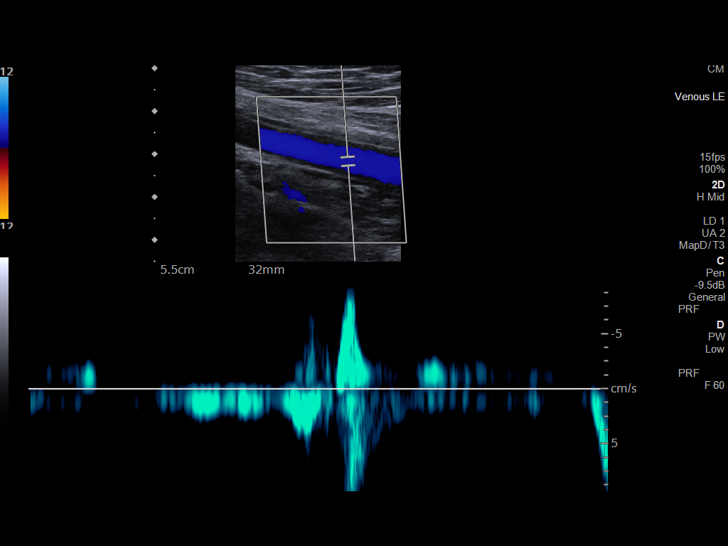
[im 16/61]
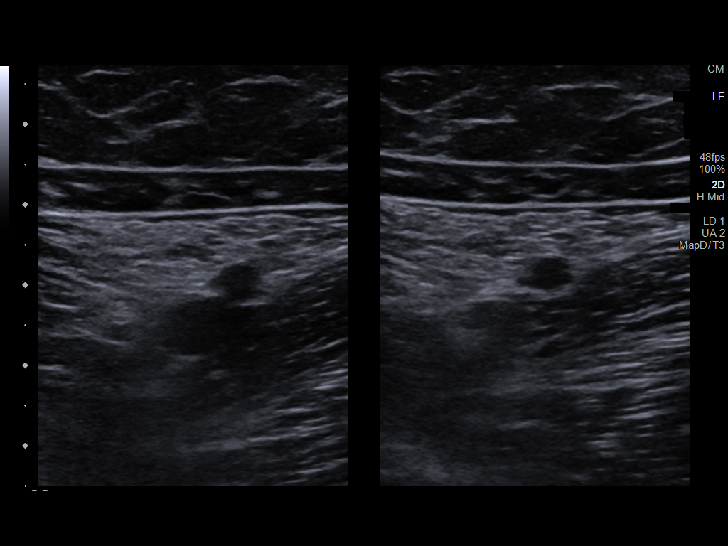
[im 19/61]
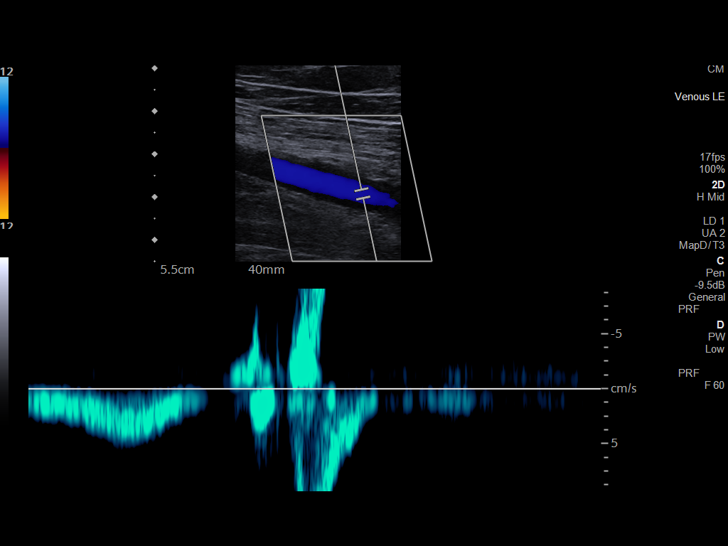
[im 24/61]
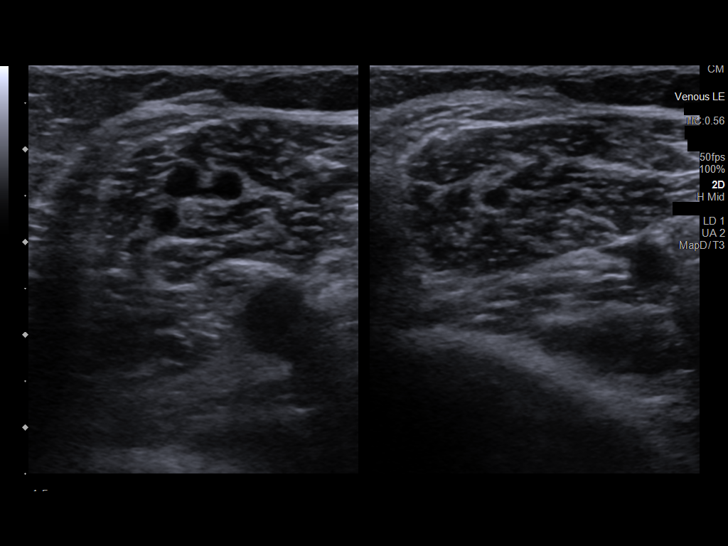
[im 29/61]
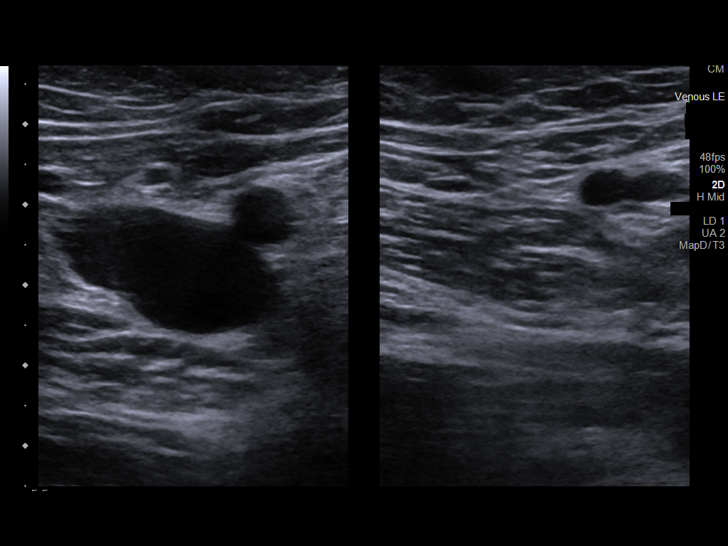
[im 32/61]
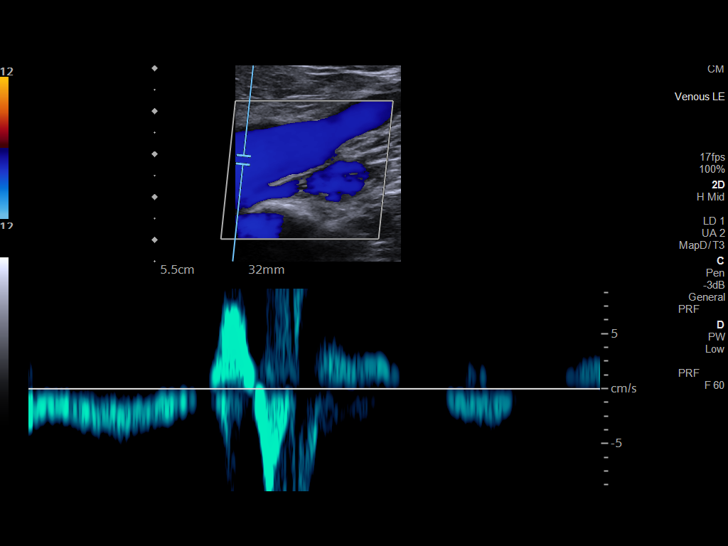
[im 37/61]
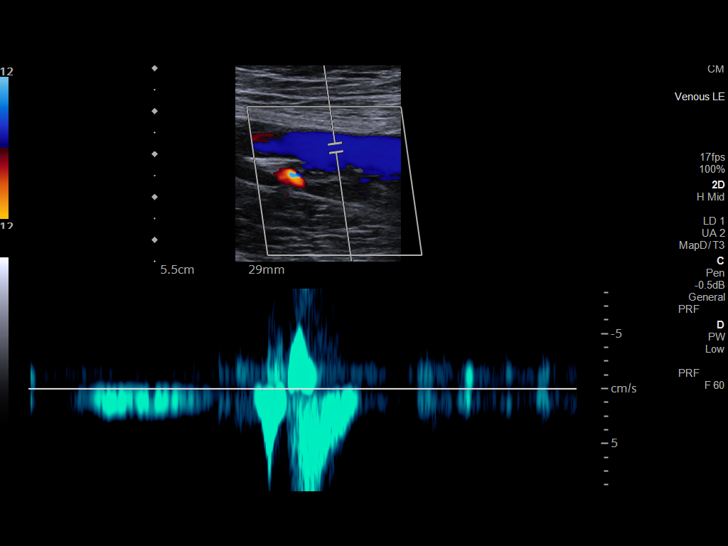
[im 42/61]
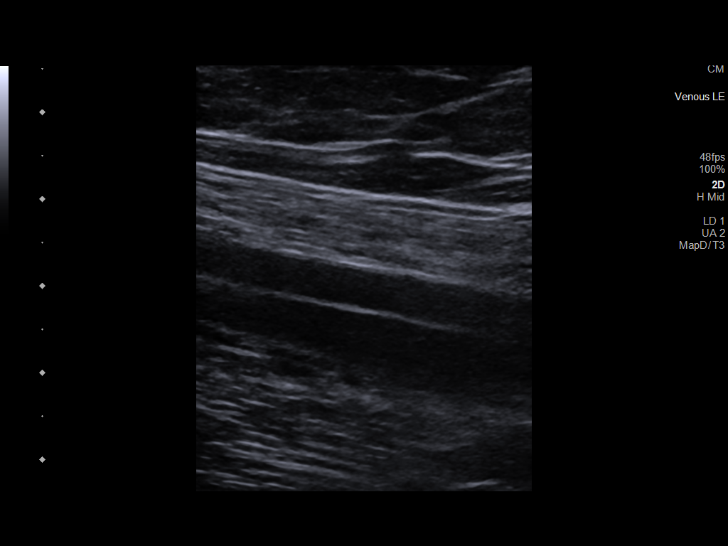
[im 47/61]
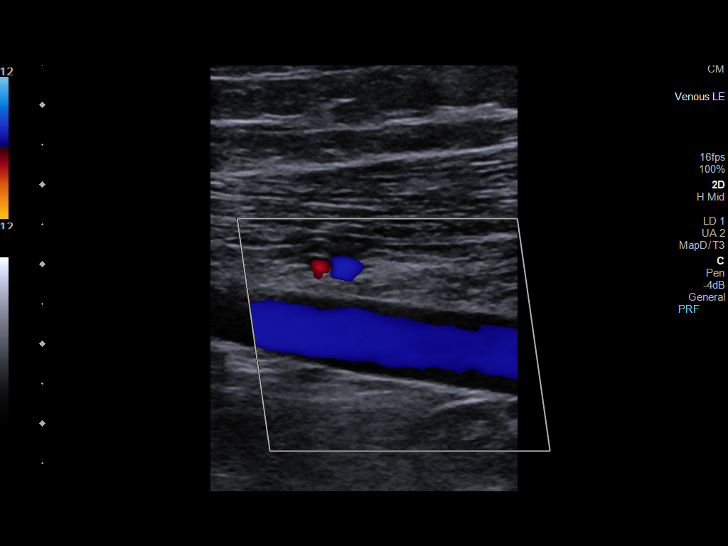
[im 50/61]
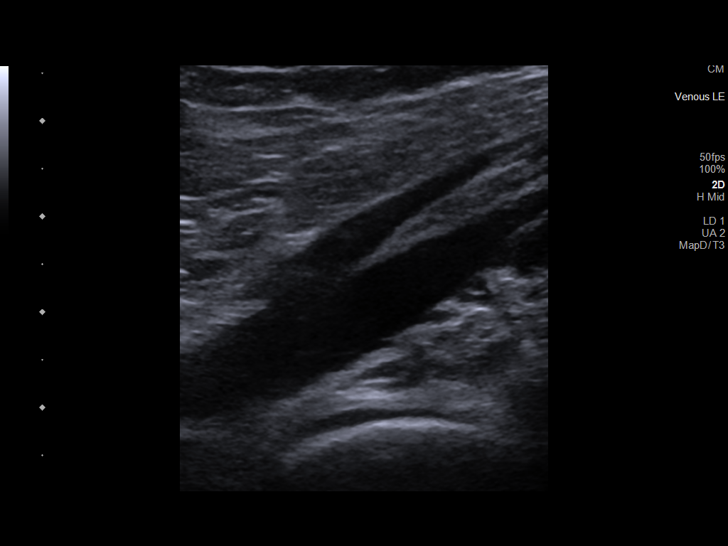
[im 55/61]
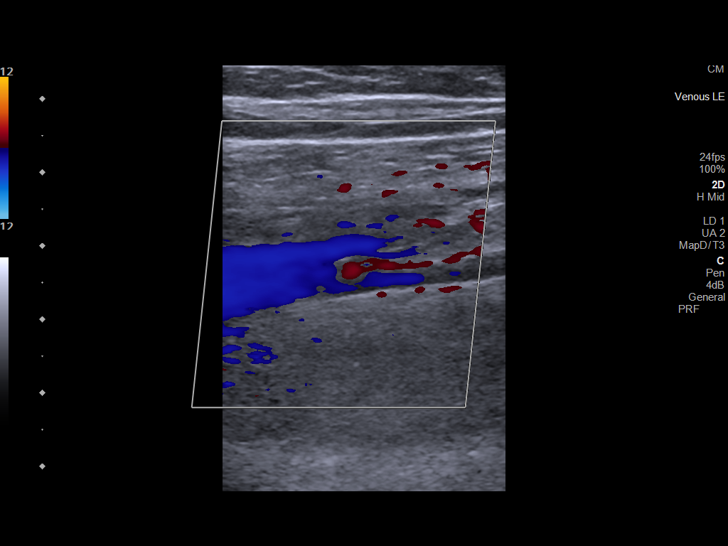
[im 61/61]
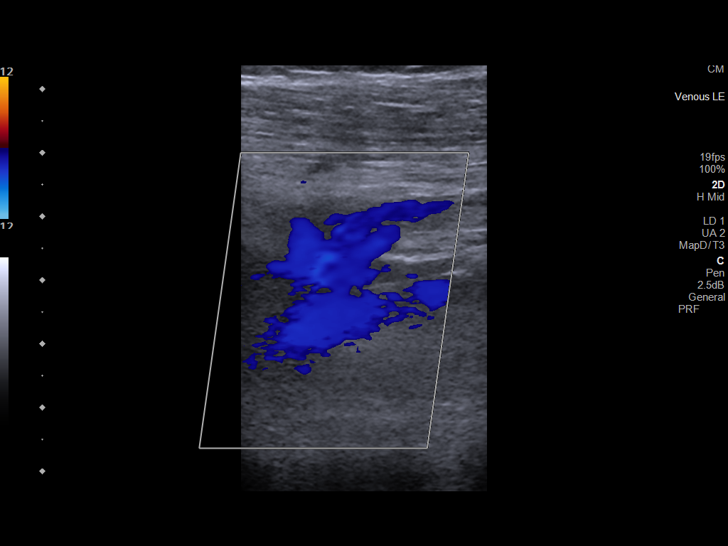

[14 of 24 positions shown; findings below may reference images not displayed]

FINDINGS: VENOUS

Normal compressibility of the common femoral, superficial femoral,
and popliteal veins, as well as the visualized calf veins.
Visualized portions of profunda femoral vein and great saphenous
vein unremarkable. No filling defects to suggest DVT on grayscale or
color Doppler imaging. Doppler waveforms show normal direction of
venous flow, normal respiratory plasticity and response to
augmentation.

OTHER

None.

Limitations: none
IMPRESSION: Negative.

## 2022-05-18 ENCOUNTER — Other Ambulatory Visit (HOSPITAL_COMMUNITY): Payer: Self-pay

## 2022-06-09 IMAGING — DX DG CHEST 2V
2 series · 2 of 2 positions shown · non-contrast
Comparison: 12/02/19

CLINICAL DATA: Shortness of breath and cough

EXAM:
CHEST - 2 VIEW

[chest pa]
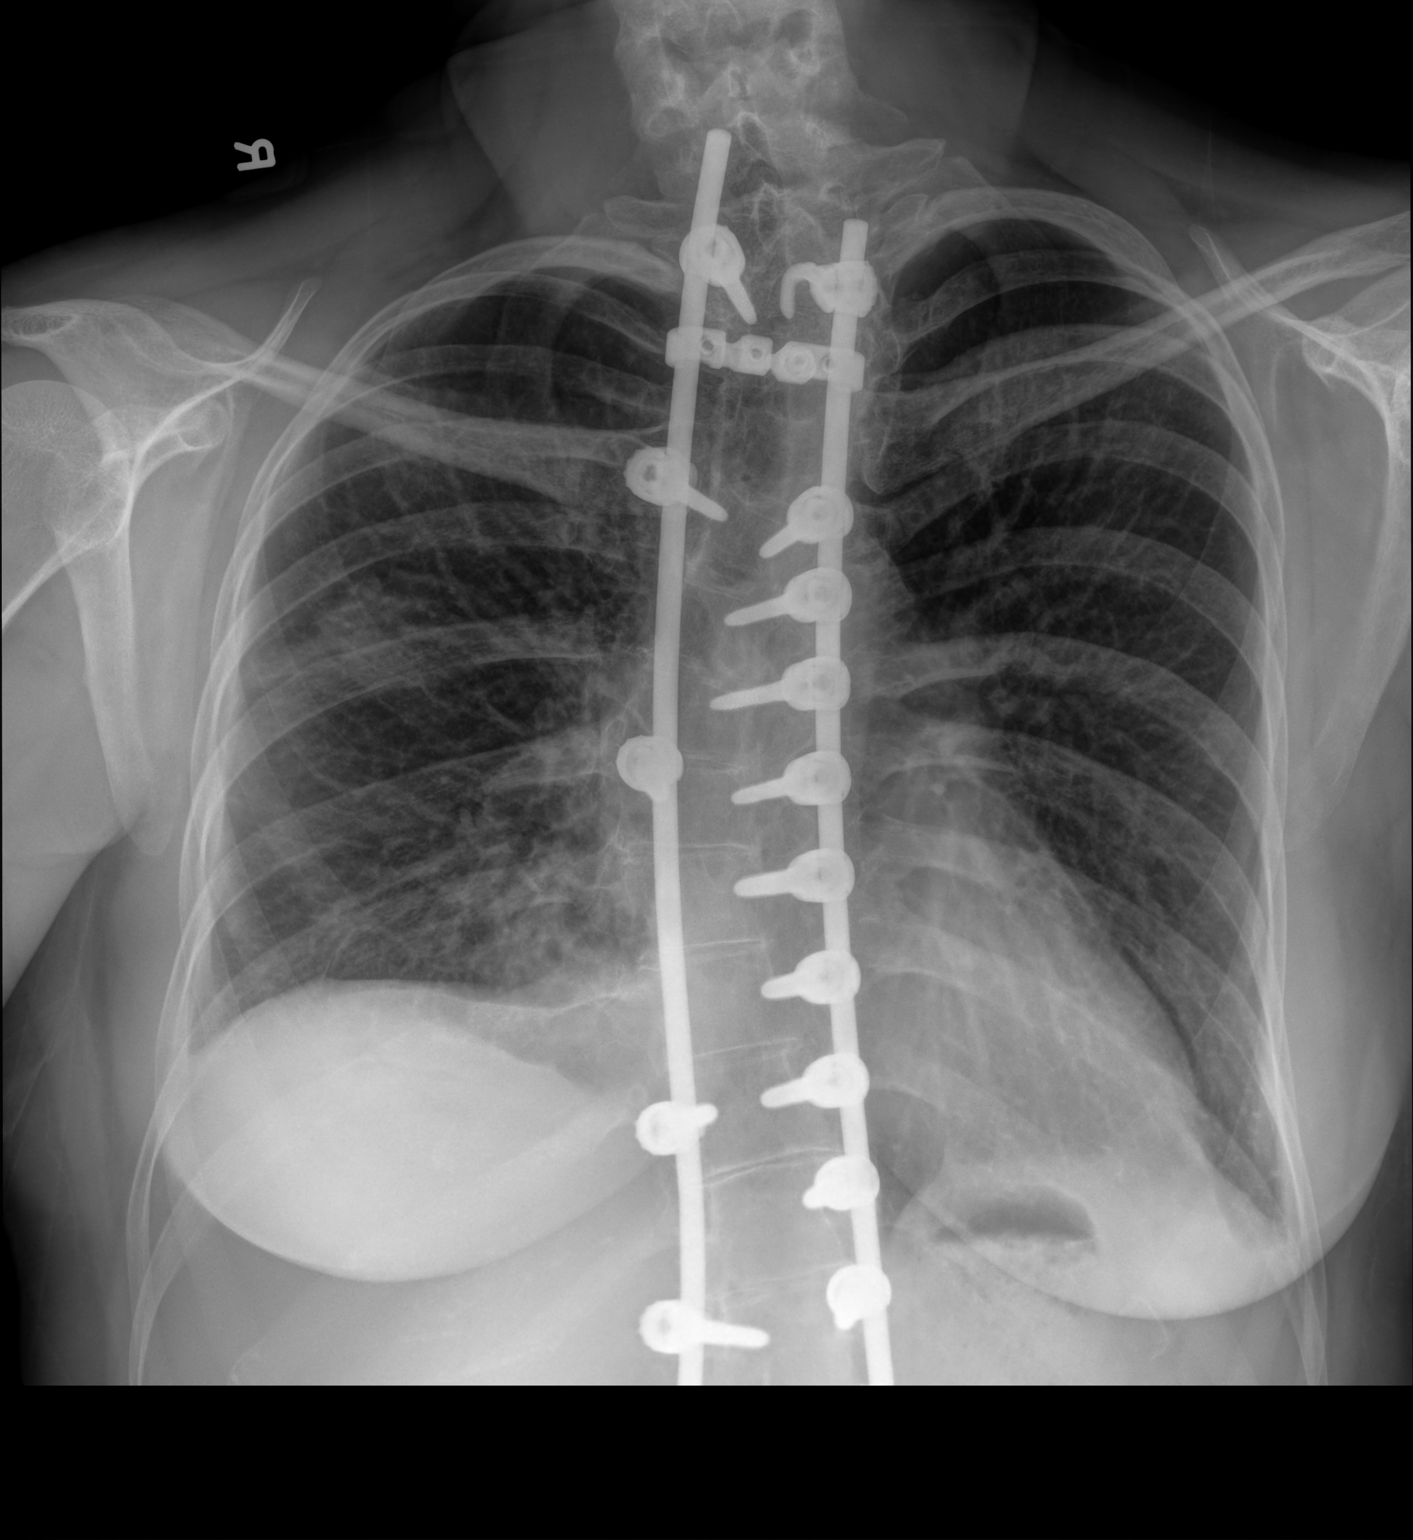

[chest lat]
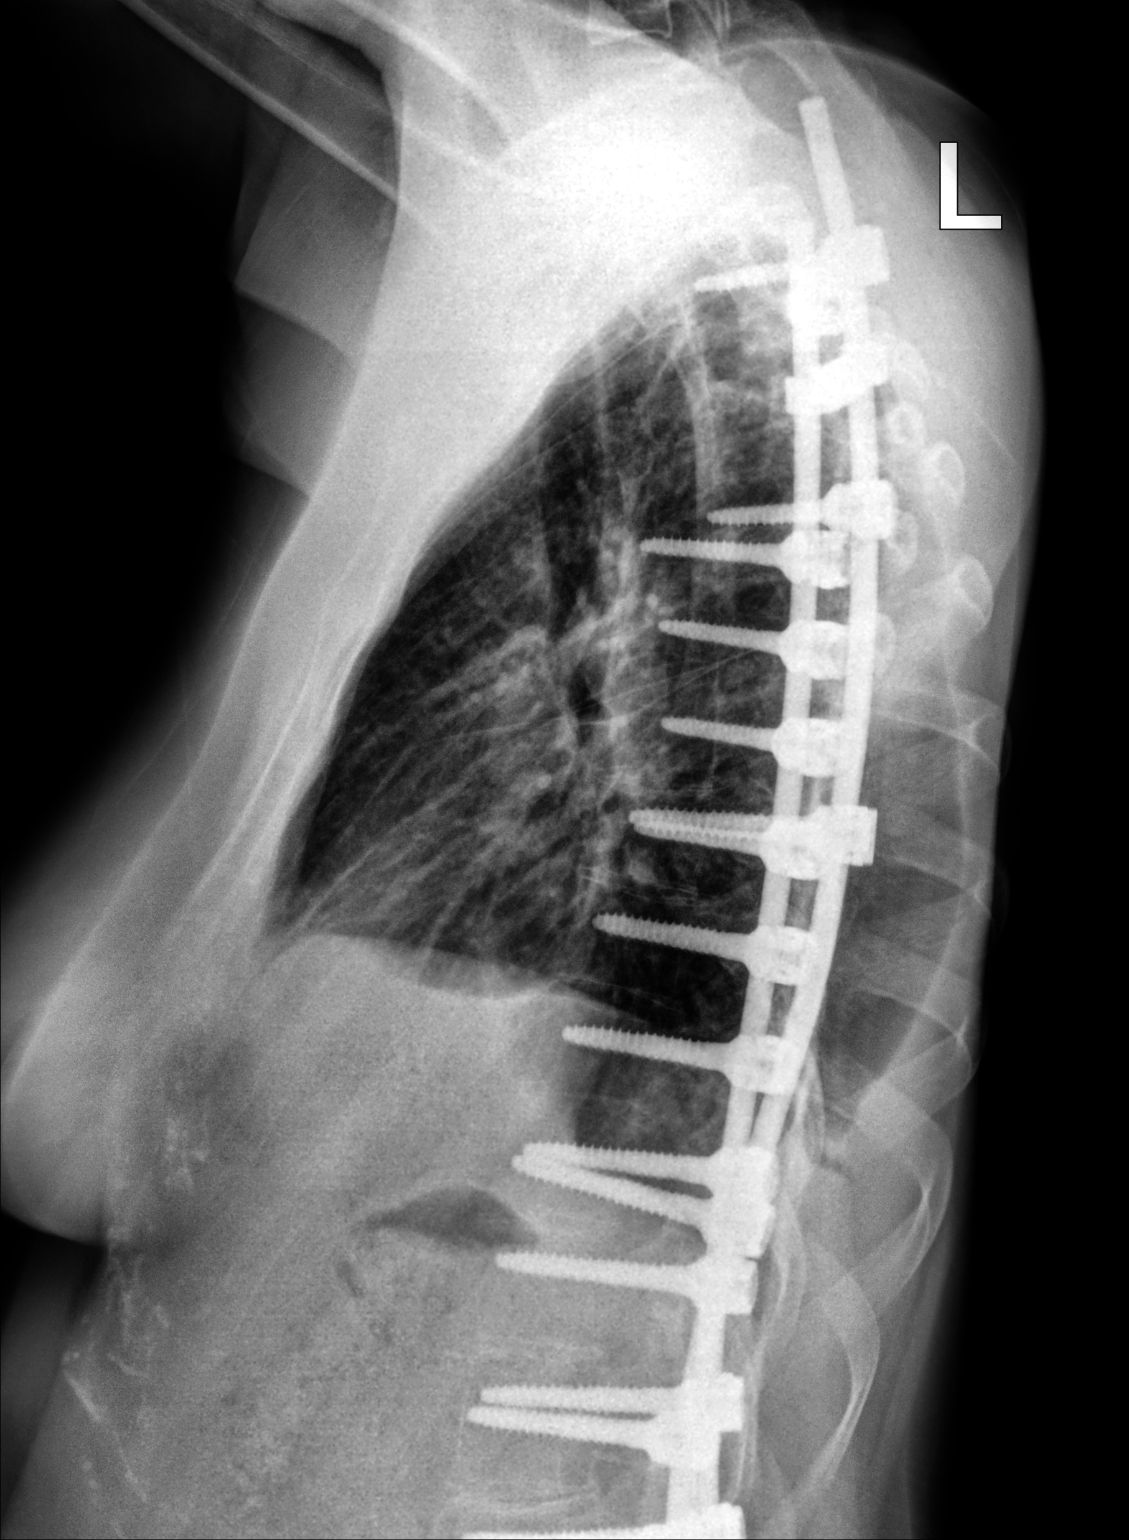

[2 of 2 positions shown; findings below may reference images not displayed]

FINDINGS: Stable cardiomediastinal contours. Asymmetric elevation of the right
hemidiaphragm. Airspace opacity within the right upper lobe is new
from previous exam. Left lung clear. Scoliosis of the thoracolumbar
spine with posterior spinal fixation hardware present.
IMPRESSION: 1. New right upper lobe airspace opacity which may represent an area
of previous pulmonary infarct versus pneumonia
2. Persistent asymmetric elevation of the right hemidiaphragm.

## 2022-08-09 ENCOUNTER — Other Ambulatory Visit: Payer: Self-pay | Admitting: Oncology

## 2022-08-09 DIAGNOSIS — Z006 Encounter for examination for normal comparison and control in clinical research program: Secondary | ICD-10-CM

## 2022-08-23 ENCOUNTER — Ambulatory Visit (INDEPENDENT_AMBULATORY_CARE_PROVIDER_SITE_OTHER): Payer: 59 | Admitting: Women's Health

## 2022-08-23 ENCOUNTER — Encounter: Payer: Self-pay | Admitting: Women's Health

## 2022-08-23 ENCOUNTER — Other Ambulatory Visit (HOSPITAL_COMMUNITY)
Admission: RE | Admit: 2022-08-23 | Discharge: 2022-08-23 | Disposition: A | Discharge status: Home / Self Care | Payer: 59 | Source: Ambulatory Visit | Attending: Women's Health | Admitting: Women's Health

## 2022-08-23 VITALS — BP 134/81 | Ht 67.0 in | Wt 175.5 lb

## 2022-08-23 DIAGNOSIS — Z113 Encounter for screening for infections with a predominantly sexual mode of transmission: Secondary | ICD-10-CM | POA: Insufficient documentation

## 2022-08-23 DIAGNOSIS — N926 Irregular menstruation, unspecified: Secondary | ICD-10-CM

## 2022-08-23 NOTE — Progress Notes (Signed)
GYN VISIT Patient name: Shirley Peters MRN [id number]  Date of birth: [birth year] Chief Complaint:   Gynecologic Exam (Continuous bleeding since delivery)  History of Present Illness:   Shirley Peters is a 32 y.o. G91P2002 Caucasian female s/p SVB 02/25/22, being seen today for report of bleeding since giving birth. Feels like she bled heavier for about after birth, then hasn't been a day w/o blood, sometimes just spotting, sometimes soaks super tampon in 4hrs, some cramps but not bad, no clots. Always has had heavy discharge, but no odor/itching/irritation.  On Slynd. Did well w/ micronor in past. Not ready to change yet, will think about it.    No LMP recorded (lmp unknown). The current method of family planning is  Slynd .  Last pap 04/04/22. Results were: NILM w/ HRHPV negative     09/06/2021   10:10 AM 03/22/2021    3:01 PM 01/21/2020    8:23 AM 04/10/2019    9:25 AM  Depression screen PHQ 2/9  Decreased Interest 0 0 0 0  Down, Depressed, Hopeless 0 0 0 0  PHQ - 2 Score 0 0 0 0  Altered sleeping 2 1  1   Tired, decreased energy 2 1  1   Change in appetite 0 0  0  Feeling bad or failure about yourself  0 0  0  Trouble concentrating 0 0  0  Moving slowly or fidgety/restless 0 0  0  Suicidal thoughts 0 0  0  PHQ-9 Score 4 2  2   Difficult doing work/chores    Not difficult at all        09/06/2021   10:10 AM 03/22/2021    3:01 PM 04/10/2019    9:26 AM  GAD 7 : Generalized Anxiety Score  Nervous, Anxious, on Edge 0 0 0  Control/stop worrying 0 0 0  Worry too much - different things 0 1 0  Trouble relaxing 0 1 0  Restless 0 0 0  Easily annoyed or irritable 1 1 1   Afraid - awful might happen 0 0 0  Total GAD 7 Score 1 3 1   Anxiety Difficulty   Not difficult at all     Review of Systems:   Pertinent items are noted in HPI Denies fever/chills, dizziness, headaches, visual disturbances, fatigue, shortness of breath, chest pain, abdominal pain, vomiting, abnormal vaginal  discharge/itching/odor/irritation, problems with periods, bowel movements, urination, or intercourse unless otherwise stated above.  Pertinent History Reviewed:  Reviewed past medical,surgical, social, obstetrical and family history.  Reviewed problem list, medications and allergies. Physical Assessment:   Vitals:   08/23/22 1457  BP: 134/81  Weight: 175 lb 8 oz (79.6 kg)  Height: 5\' 7"  (1.702 m)  Body mass index is 27.49 kg/m.       Physical Examination:   General appearance: alert, well appearing, and in no distress  Mental status: alert, oriented to person, place, and time  Skin: warm & dry   Cardiovascular: normal heart rate noted  Respiratory: normal respiratory effort, no distress  Abdomen: soft, non-tender   Pelvic: VULVA: normal appearing vulva with no masses, tenderness or lesions, VAGINA: normal appearing vagina with normal color and discharge, no lesions, small amt menstrual type blodo CERVIX: normal appearing cervix without discharge or lesions, UTERUS: uterus is normal size, shape, consistency and nontender, ADNEXA: normal adnexa in size, nontender and no masses  Extremities: no edema   Chaperone:  Sherri Kaywood     No results found for this or  any previous visit (from the past 24 hour(s)).  Assessment & Plan:  1) Prolonged bleeding after SVB in Feb> CV swab, if + will treat and see if resolves, if neg consider pelvic u/s (pt ok w/ this), consider switching contraception- pt wants to think about this some more  Meds: No orders of the defined types were placed in this encounter.   No orders of the defined types were placed in this encounter.   No follow-ups on file.  Cheral Marker CNM, [facility] 08/23/2022 3:03 PM

## 2022-08-29 NOTE — Addendum Note (Signed)
Addended by: Cheral Marker on: 08/29/2022 09:08 AM   Modules accepted: Orders

## 2022-08-30 ENCOUNTER — Ambulatory Visit (HOSPITAL_BASED_OUTPATIENT_CLINIC_OR_DEPARTMENT_OTHER)
Admission: RE | Admit: 2022-08-30 | Discharge: 2022-08-30 | Disposition: A | Discharge status: Home / Self Care | Payer: 59 | Source: Ambulatory Visit | Attending: Women's Health | Admitting: Women's Health

## 2022-08-30 ENCOUNTER — Encounter: Payer: Self-pay | Admitting: Women's Health

## 2022-08-30 DIAGNOSIS — N926 Irregular menstruation, unspecified: Secondary | ICD-10-CM | POA: Diagnosis not present

## 2022-08-30 DIAGNOSIS — N920 Excessive and frequent menstruation with regular cycle: Secondary | ICD-10-CM | POA: Diagnosis not present

## 2022-08-31 ENCOUNTER — Other Ambulatory Visit (HOSPITAL_COMMUNITY): Payer: Self-pay

## 2022-08-31 ENCOUNTER — Other Ambulatory Visit: Payer: Self-pay | Admitting: Women's Health

## 2022-08-31 MED ORDER — PHEXXI 1.8-1-0.4 % VA GEL: 1.0000 | Freq: Once | VAGINAL | 11 refills | Status: AC

## 2022-08-31 MED ORDER — PHEXXI 1.8-1-0.4 % VA GEL
VAGINAL | 11 refills | Status: DC
  Filled 2022-08-31: qty 180, 30d supply, fill #0

## 2022-09-02 ENCOUNTER — Other Ambulatory Visit: Payer: Self-pay | Admitting: *Deleted

## 2022-09-02 MED ORDER — PHEXXI 1.8-1-0.4 % VA GEL: VAGINAL | 11 refills | Status: DC

## 2022-09-05 ENCOUNTER — Other Ambulatory Visit: Payer: Self-pay | Admitting: Women's Health

## 2022-09-05 ENCOUNTER — Other Ambulatory Visit (HOSPITAL_COMMUNITY): Payer: Self-pay

## 2022-09-05 ENCOUNTER — Other Ambulatory Visit: Payer: Self-pay | Admitting: *Deleted

## 2022-09-05 MED ORDER — ONDANSETRON HCL 4 MG PO TABS
4.0000 mg | ORAL_TABLET | Freq: Three times a day (TID) | ORAL | 0 refills | Status: AC | PRN
  Filled 2022-09-05: qty 20, 7d supply, fill #0

## 2022-09-05 MED ORDER — PHEXXI 1.8-1-0.4 % VA GEL
VAGINAL | 11 refills | Status: AC
Start: 2022-09-05 — End: ?
  Filled 2022-09-05: qty 180, 90d supply, fill #0

## 2022-09-06 ENCOUNTER — Other Ambulatory Visit: Payer: Self-pay

## 2022-09-29 ENCOUNTER — Ambulatory Visit: Payer: 59 | Admitting: Cardiology

## 2022-11-10 ENCOUNTER — Ambulatory Visit: Payer: 59 | Attending: Cardiology | Admitting: Cardiology

## 2022-11-10 ENCOUNTER — Other Ambulatory Visit (HOSPITAL_COMMUNITY): Payer: Self-pay

## 2022-11-10 ENCOUNTER — Other Ambulatory Visit: Payer: Self-pay

## 2022-11-10 ENCOUNTER — Encounter: Payer: Self-pay | Admitting: Cardiology

## 2022-11-10 ENCOUNTER — Ambulatory Visit: Payer: 59

## 2022-11-10 VITALS — BP 120/78 | HR 103 | Ht 67.0 in | Wt 178.6 lb

## 2022-11-10 DIAGNOSIS — I493 Ventricular premature depolarization: Secondary | ICD-10-CM

## 2022-11-10 NOTE — Progress Notes (Signed)
    Cardiology Office Note  Date: 11/10/2022   ID: Shirley Peters, DOB [birth year], MRN [id number]  History of Present Illness: Shirley Peters is a 32 y.o. female last seen in March.  She is here for a follow-up visit.  Reports occasional sense of palpitations, also some chest discomfort predominantly associated with stress.  She has not had any sudden dizziness or syncope.  Prior to pregnancy she had been on Toprol XL for treatment of frequent PVCs.  PVC burden was approximately 11% as of November last year.  She was transition to nifedipine during her pregnancy and is no longer on this medication.  We discussed obtaining a limited cardiac monitor to reevaluate PVC burden prior to reinitiating beta-blocker.  She is still breast-feeding and may consider another pregnancy late next year.  Physical Exam: VS:  BP 120/78 (BP Location: Left Arm, Patient Position: Sitting, Cuff Size: Normal)   Pulse (!) 103   Ht 5\' 7"  (1.702 m)   Wt 178 lb 9.6 oz (81 kg)   SpO2 96%   BMI 27.97 kg/m , BMI Body mass index is 27.97 kg/m.  Wt Readings from Last 3 Encounters:  11/10/22 178 lb 9.6 oz (81 kg)  08/23/22 175 lb 8 oz (79.6 kg)  04/04/22 191 lb (86.6 kg)    General: Patient appears comfortable at rest. HEENT: Conjunctiva and lids normal. Cardiac: Regular rate and rhythm, no S3 or significant systolic murmur, no pericardial rub.  ECG:  An ECG dated 03/24/2022 was personally reviewed today and demonstrated:  Sinus rhythm.  Labwork: 02/25/2022: ALT 14; AST 16; Hemoglobin 12.2; Platelets 241 02/27/2022: BUN <5; Creatinine, Ser 0.34; Magnesium 1.8; Potassium 2.9; Sodium 139   Other Studies Reviewed Today:  No interval cardiac testing for review today.  Assessment and Plan:  Frequent PVCs.  LVEF 60 to 65% by echocardiogram in November 2023.  At that point she had approximately 11% PVC burden by cardiac monitor.  Plan to obtain a 72-hour Zio patch to reinvestigate PVC burden at this point prior to  determining if any further therapy needs to be reinitiated.  Disposition:  Follow up  test results.  Signed, Jonelle Sidle, M.D., F.A.C.C.  [facility]

## 2022-11-10 NOTE — Patient Instructions (Signed)
Medication Instructions:   Your physician recommends that you continue on your current medications as directed. Please refer to the Current Medication list given to you today.   Labwork: None today  Testing/Procedures: 72 hour Zio  Monitor  Do not get wet for the first 24 hours  Follow-Up: We will call you with results  Any Other Special Instructions Will Be Listed Below (If Applicable).  If you need a refill on your cardiac medications before your next appointment, please call your pharmacy.

## 2022-11-22 DIAGNOSIS — I493 Ventricular premature depolarization: Secondary | ICD-10-CM | POA: Diagnosis not present

## 2022-11-24 IMAGING — CT CT ANGIO CHEST
2 of 6 series · 19 of 36 positions shown · IV contrast (Omnipaque or Isovue)
Comparison: CT a chest dated December 02, 2019.

CLINICAL DATA: Pulmonary embolism follow-up.

EXAM:
CT ANGIOGRAPHY CHEST WITH CONTRAST
TECHNIQUE: Multidetector CT imaging of the chest was performed using the
standard protocol during bolus administration of intravenous
contrast. Multiplanar CT image reconstructions and MIPs were
obtained to evaluate the vascular anatomy.
CONTRAST:  100mL OMNIPAQUE IOHEXOL 350 MG/ML SOLN

[Series 5: pe axial thins · axial · 0.76mm/px · z∈[+1215,+1495]mm · 18 of 389 slices shown]
[im 19/389  lung]
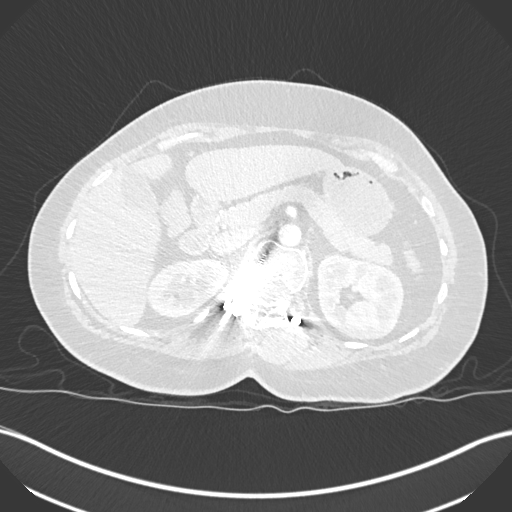
[im 37/389  mediastinal]
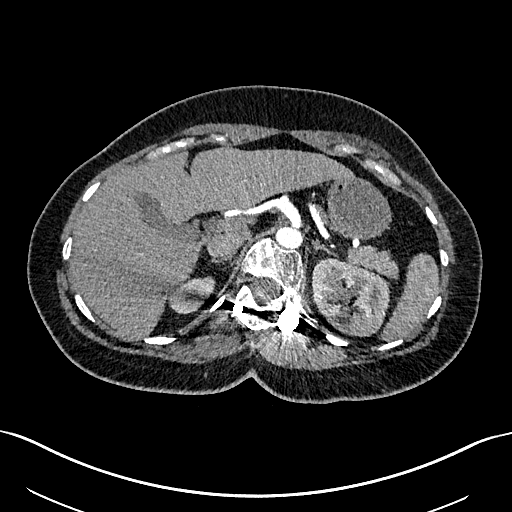
[im 56/389  lung]
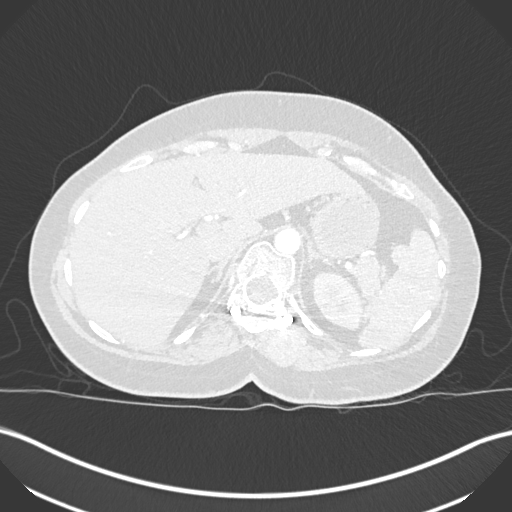
[im 74/389  mediastinal]
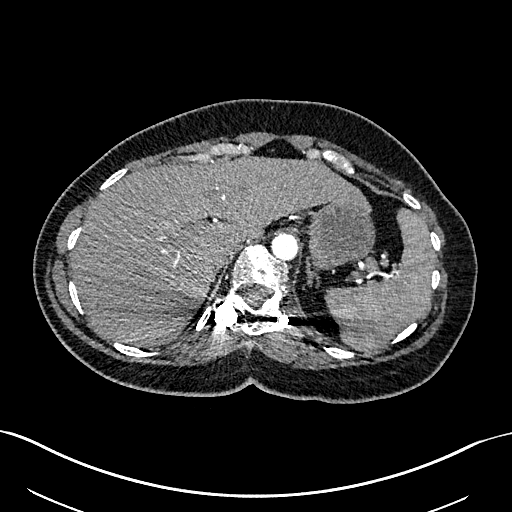
[im 111/389  lung]
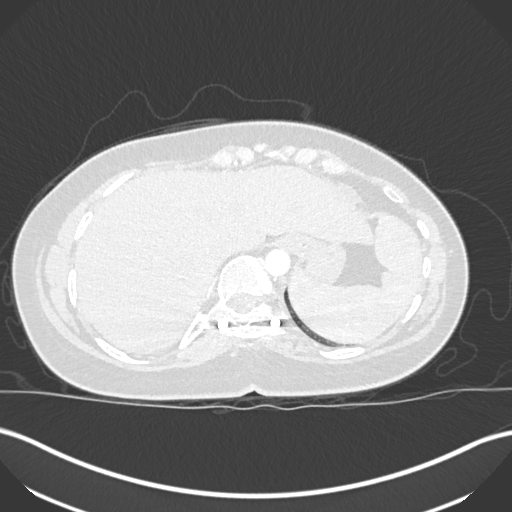
[im 130/389  mediastinal]
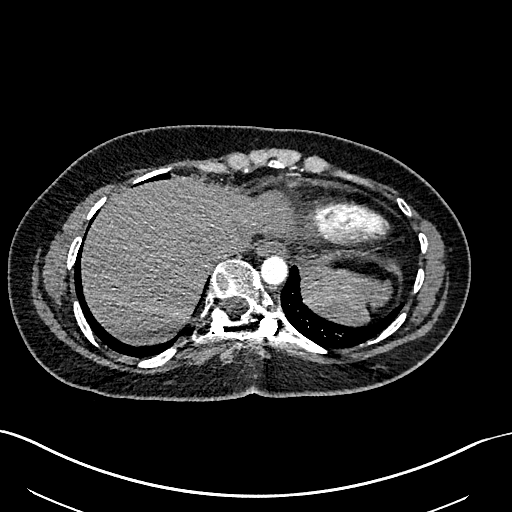
[im 148/389  lung]
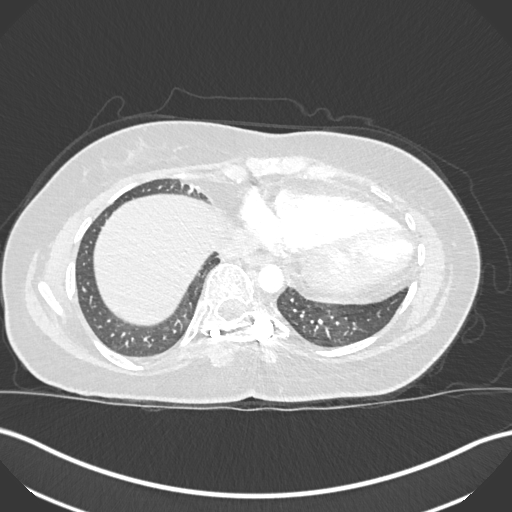
[im 167/389  mediastinal]
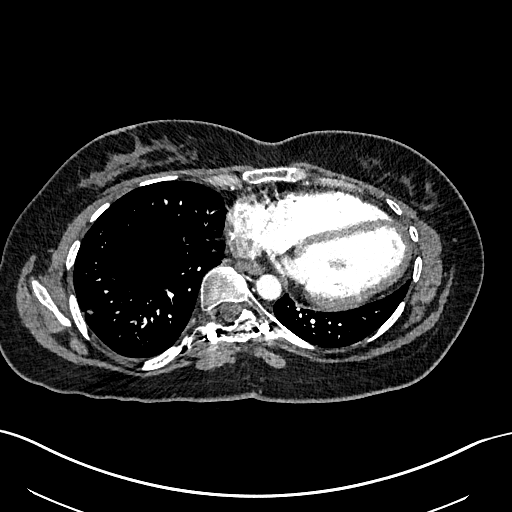
[im 185/389  lung]
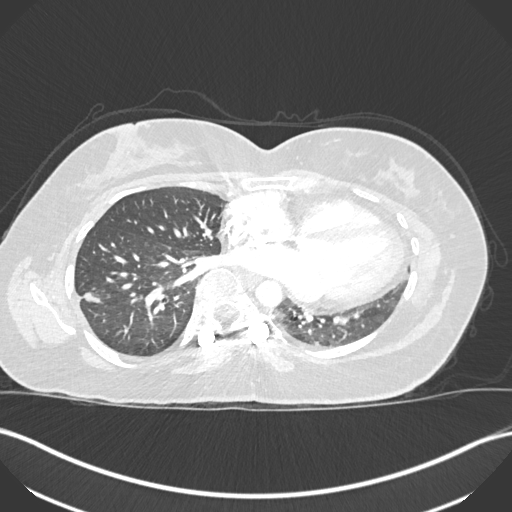
[im 204/389  mediastinal]
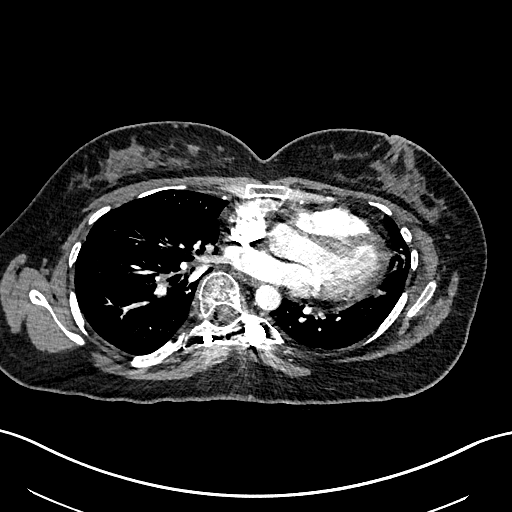
[im 222/389  lung]
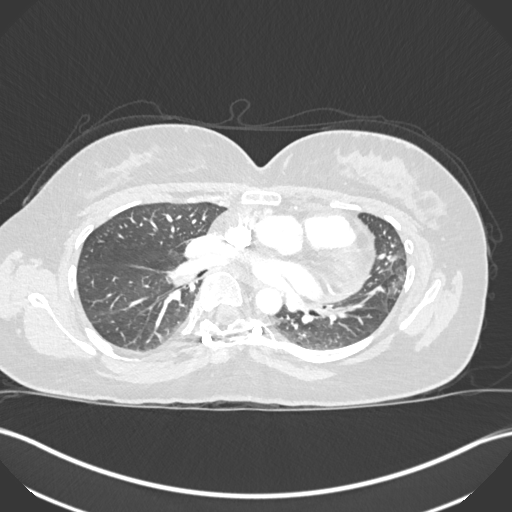
[im 241/389  mediastinal]
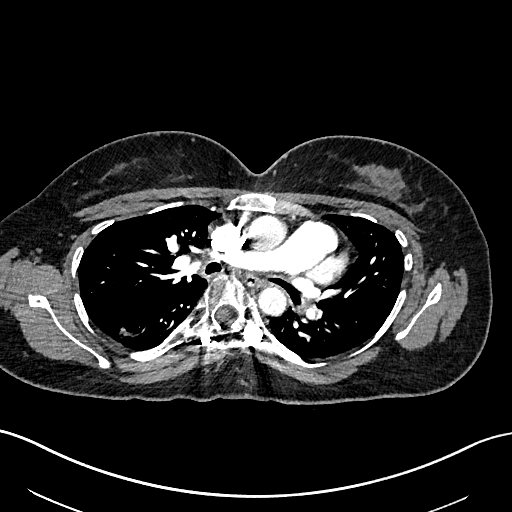
[im 259/389  lung]
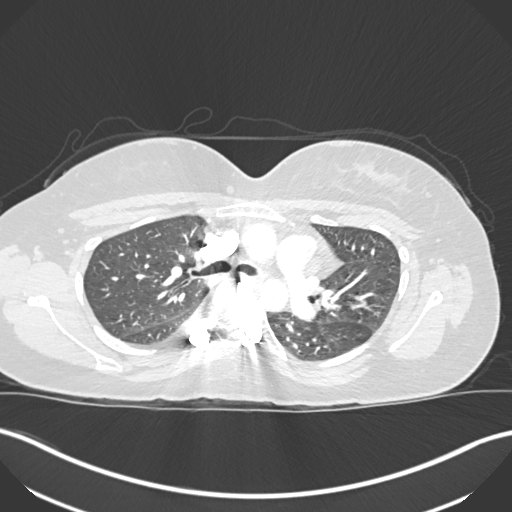
[im 296/389  mediastinal]
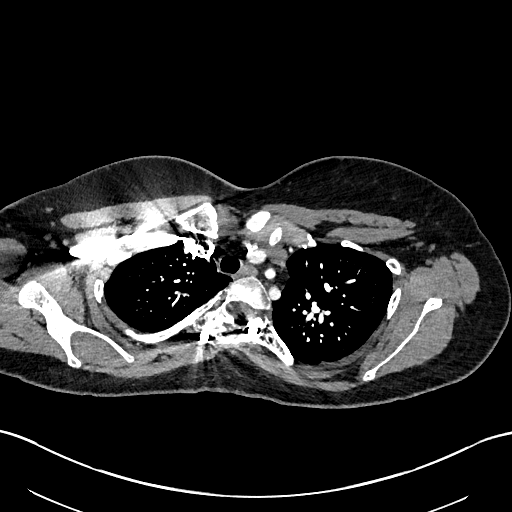
[im 315/389  lung]
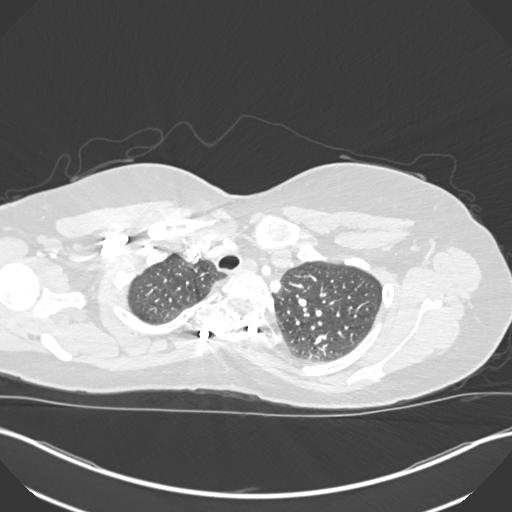
[im 333/389  mediastinal]
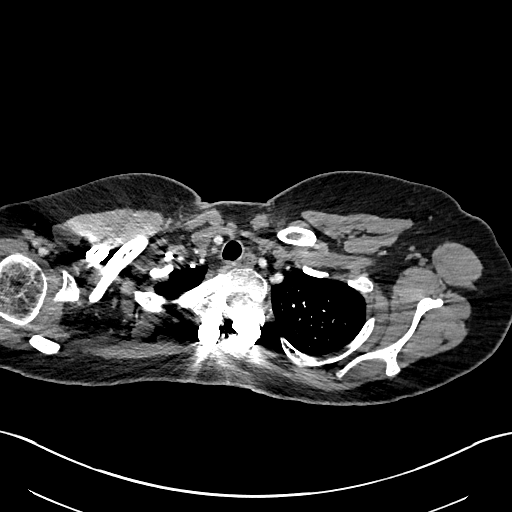
[im 352/389  lung]
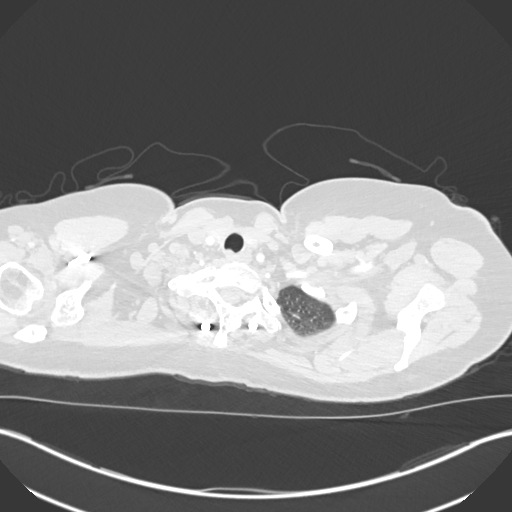
[im 370/389  mediastinal]
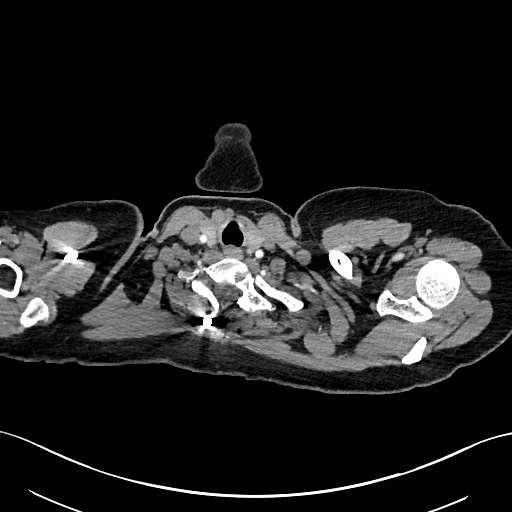

[Series 7: cor soft · coronal · 0.62mm/px · 1 of 133 slices shown]
[im 67/133  mediastinal]
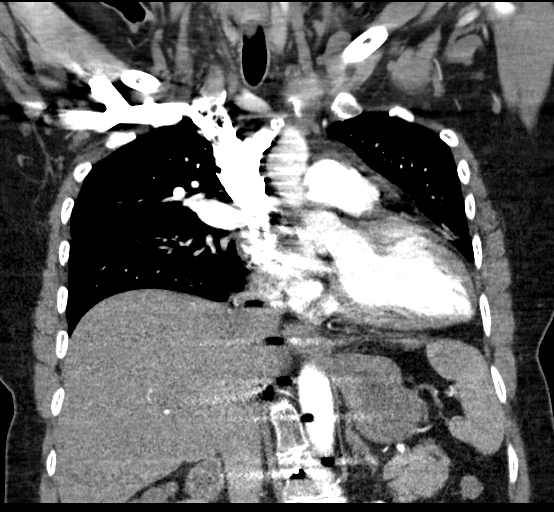

[19 of 36 positions shown; findings below may reference images not displayed]

FINDINGS: Cardiovascular: Satisfactory opacification of the pulmonary arteries
to the segmental level. Right-sided pulmonary emboli have
essentially resolved with residual webs noted in the distal
interlobar pulmonary artery (series 4, image 49). Normal heart size.
Trace pericardial fluid is unchanged sepsis.

Mediastinum/Nodes: No enlarged mediastinal, hilar, or axillary lymph
nodes. Thyroid gland, trachea, and esophagus demonstrate no
significant findings.

Lungs/Pleura: New pleuroparenchymal scarring in the right lower
lobe. Chronic scarring in the lingula. No focal consolidation,
pleural effusion, or pneumothorax.

Upper Abdomen: No acute abnormality.

Musculoskeletal: No chest wall abnormality. No acute or significant
osseous findings. Prior thoracolumbar fusion for scoliosis again
noted.

Review of the MIP images confirms the above findings.
IMPRESSION: 1. Right-sided pulmonary emboli have essentially resolved with
residual webs noted in the distal interlobar pulmonary artery.
2. New postinflammatory scarring in the right lower lobe.

## 2023-01-25 ENCOUNTER — Other Ambulatory Visit (HOSPITAL_COMMUNITY): Payer: Self-pay

## 2023-08-03 ENCOUNTER — Other Ambulatory Visit (HOSPITAL_BASED_OUTPATIENT_CLINIC_OR_DEPARTMENT_OTHER): Payer: Self-pay

## 2023-08-03 ENCOUNTER — Other Ambulatory Visit (HOSPITAL_COMMUNITY): Payer: Self-pay

## 2023-08-03 MED ORDER — ATOMOXETINE HCL 25 MG PO CAPS
50.0000 mg | ORAL_CAPSULE | Freq: Every day | ORAL | 1 refills | Status: AC
  Filled 2023-08-03: qty 180, 90d supply, fill #0

## 2023-08-03 MED ORDER — ESCITALOPRAM OXALATE 10 MG PO TABS
10.0000 mg | ORAL_TABLET | Freq: Every day | ORAL | 1 refills | Status: AC
  Filled 2023-08-03: qty 90, 90d supply, fill #0

## 2023-10-20 ENCOUNTER — Other Ambulatory Visit: Payer: Self-pay | Admitting: Medical Genetics

## 2023-10-20 DIAGNOSIS — Z006 Encounter for examination for normal comparison and control in clinical research program: Secondary | ICD-10-CM
